# Patient Record
Sex: Male | Born: 1950 | Race: Black or African American | Hispanic: No | Marital: Married | Smoking: Former smoker
Health system: Southern US, Community
[De-identification: ages and names within clinical notes are randomized; demographics above are authoritative.]

## PROBLEM LIST (undated history)

## (undated) DIAGNOSIS — I639 Cerebral infarction, unspecified: Secondary | ICD-10-CM

## (undated) DIAGNOSIS — I82409 Acute embolism and thrombosis of unspecified deep veins of unspecified lower extremity: Secondary | ICD-10-CM

---

## 1999-07-19 ENCOUNTER — Emergency Department (HOSPITAL_COMMUNITY): Admission: EM | Admit: 1999-07-19 | Discharge: 1999-07-19 | Payer: Self-pay | Admitting: Emergency Medicine

## 2001-12-23 ENCOUNTER — Encounter: Payer: Self-pay | Admitting: Urology

## 2001-12-23 ENCOUNTER — Encounter: Admission: RE | Admit: 2001-12-23 | Discharge: 2001-12-23 | Payer: Self-pay | Admitting: Urology

## 2001-12-28 ENCOUNTER — Ambulatory Visit (HOSPITAL_BASED_OUTPATIENT_CLINIC_OR_DEPARTMENT_OTHER): Admission: RE | Admit: 2001-12-28 | Discharge: 2001-12-28 | Payer: Self-pay | Admitting: Urology

## 2002-05-26 ENCOUNTER — Inpatient Hospital Stay (HOSPITAL_COMMUNITY): Admission: RE | Admit: 2002-05-26 | Discharge: 2002-05-27 | Payer: Self-pay | Admitting: Neurosurgery

## 2002-05-26 ENCOUNTER — Encounter: Payer: Self-pay | Admitting: Neurosurgery

## 2002-08-02 ENCOUNTER — Encounter: Admission: RE | Admit: 2002-08-02 | Discharge: 2002-10-21 | Payer: Self-pay | Admitting: Neurosurgery

## 2005-03-14 ENCOUNTER — Ambulatory Visit: Payer: Self-pay | Admitting: Family Medicine

## 2005-03-14 ENCOUNTER — Inpatient Hospital Stay (HOSPITAL_COMMUNITY): Admission: EM | Admit: 2005-03-14 | Discharge: 2005-03-19 | Payer: Self-pay | Admitting: Emergency Medicine

## 2006-02-03 ENCOUNTER — Ambulatory Visit: Payer: Self-pay | Admitting: Oncology

## 2006-02-17 LAB — PROTIME-INR
INR: 1 — ABNORMAL LOW (ref 2.00–3.50)
Protime: 12.6 Seconds (ref 10.6–13.4)

## 2006-02-18 LAB — PROTIME-INR
INR: 1 — ABNORMAL LOW (ref 2.00–3.50)
Protime: 12.5 Seconds (ref 10.6–13.4)

## 2006-02-24 LAB — CBC WITH DIFFERENTIAL/PLATELET
Basophils Absolute: 0 10*3/uL (ref 0.0–0.1)
EOS%: 5 % (ref 0.0–7.0)
Eosinophils Absolute: 0.2 10*3/uL (ref 0.0–0.5)
LYMPH%: 45.3 % (ref 14.0–48.0)
MCH: 31.2 pg (ref 28.0–33.4)
MCV: 91.4 fL (ref 81.6–98.0)
MONO%: 5.5 % (ref 0.0–13.0)
Platelets: 218 10*3/uL (ref 145–400)
RBC: 4.53 10*6/uL (ref 4.20–5.71)
RDW: 12.5 % (ref 11.2–14.6)

## 2006-02-25 LAB — PROTHROMBIN TIME: Prothrombin Time: 14.3 seconds (ref 11.6–15.2)

## 2006-02-27 LAB — D-DIMER, QUANTITATIVE: D-Dimer, Quant: 0.62 ug/mL-FEU — ABNORMAL HIGH (ref 0.00–0.48)

## 2006-02-27 LAB — LACTATE DEHYDROGENASE: LDH: 109 U/L (ref 94–250)

## 2006-02-27 LAB — HYPERCOAGULABLE PANEL, COMPREHENSIVE
Anticardiolipin IgG: <7 [GPL'U] (ref <11)
Anticardiolipin IgM: 31 [MPL'U] (ref <10)
Beta-2 Glyco I IgG: <4 U/mL (ref <20)
Beta-2-Glycoprotein I IgM: <4 U/mL (ref <10)
Homocysteine: 12.7 umol/L (ref 4.0–15.4)
PTT Lupus Anticoagulant: 44.7 secs — ABNORMAL HIGH (ref 30.5–43.1)
PTTLA 4:1 Mix: 42 secs (ref 30.5–43.1)
Protein C Activity: 116 % (ref 91–147)
Protein C, Total: 69 % (ref 63–153)
Protein S Ag, Total: 101 % (ref 58–146)

## 2006-02-27 LAB — COMPREHENSIVE METABOLIC PANEL
AST: 14 U/L (ref 0–37)
Albumin: 4.1 g/dL (ref 3.5–5.2)
Alkaline Phosphatase: 67 U/L (ref 39–117)
BUN: 11 mg/dL (ref 6–23)
Glucose, Bld: 90 mg/dL (ref 70–99)
Potassium: 3.9 mEq/L (ref 3.5–5.3)
Sodium: 140 mEq/L (ref 135–145)
Total Bilirubin: 0.6 mg/dL (ref 0.3–1.2)

## 2006-02-28 LAB — PROTIME-INR
INR: 1.9 — ABNORMAL LOW (ref 2.00–3.50)
Protime: 17 Seconds — ABNORMAL HIGH (ref 10.6–13.4)

## 2006-03-03 LAB — PROTIME-INR

## 2006-03-06 LAB — PROTIME-INR: INR: 2.6 (ref 2.00–3.50)

## 2006-03-10 LAB — PROTIME-INR
INR: 2.4 (ref 2.00–3.50)
Protime: 18.8 Seconds — ABNORMAL HIGH (ref 10.6–13.4)

## 2006-03-14 ENCOUNTER — Ambulatory Visit (HOSPITAL_COMMUNITY): Admission: RE | Admit: 2006-03-14 | Discharge: 2006-03-14 | Payer: Self-pay | Admitting: Oncology

## 2006-03-17 ENCOUNTER — Ambulatory Visit: Payer: Self-pay | Admitting: Oncology

## 2006-03-17 LAB — PROTIME-INR

## 2006-03-20 ENCOUNTER — Ambulatory Visit (HOSPITAL_COMMUNITY): Admission: RE | Admit: 2006-03-20 | Discharge: 2006-03-20 | Payer: Self-pay | Admitting: Oncology

## 2006-03-20 ENCOUNTER — Encounter: Payer: Self-pay | Admitting: Vascular Surgery

## 2006-03-31 LAB — PROTIME-INR: Protime: 14.4 Seconds — ABNORMAL HIGH (ref 10.6–13.4)

## 2006-04-03 LAB — PROTIME-INR: Protime: 19.2 Seconds — ABNORMAL HIGH (ref 10.6–13.4)

## 2006-04-07 LAB — CBC WITH DIFFERENTIAL/PLATELET
Eosinophils Absolute: 0.2 10*3/uL (ref 0.0–0.5)
HCT: 41.7 % (ref 38.7–49.9)
LYMPH%: 38.6 % (ref 14.0–48.0)
MCHC: 34 g/dL (ref 32.0–35.9)
MCV: 91.2 fL (ref 81.6–98.0)
MONO#: 0.2 10*3/uL (ref 0.1–0.9)
MONO%: 4.6 % (ref 0.0–13.0)
NEUT#: 2.7 10*3/uL (ref 1.5–6.5)
NEUT%: 51.4 % (ref 40.0–75.0)
Platelets: 282 10*3/uL (ref 145–400)
RBC: 4.58 10*6/uL (ref 4.20–5.71)

## 2006-04-07 LAB — COMPREHENSIVE METABOLIC PANEL
Albumin: 3.9 g/dL (ref 3.5–5.2)
CO2: 27 mEq/L (ref 19–32)
Glucose, Bld: 88 mg/dL (ref 70–99)
Sodium: 141 mEq/L (ref 135–145)
Total Bilirubin: 0.6 mg/dL (ref 0.3–1.2)
Total Protein: 7.2 g/dL (ref 6.0–8.3)

## 2006-04-07 LAB — LACTATE DEHYDROGENASE: LDH: 115 U/L (ref 94–250)

## 2006-04-07 LAB — D-DIMER, QUANTITATIVE: D-Dimer, Quant: 0.51 ug/mL-FEU — ABNORMAL HIGH (ref 0.00–0.48)

## 2006-04-10 ENCOUNTER — Ambulatory Visit: Payer: Self-pay | Admitting: Oncology

## 2006-04-10 LAB — PROTIME-INR

## 2006-04-23 LAB — PROTIME-INR: Protime: 25.2 Seconds — ABNORMAL HIGH (ref 10.6–13.4)

## 2006-04-29 LAB — PROTIME-INR
INR: 2.7 (ref 2.00–3.50)
Protime: 32.4 Seconds — ABNORMAL HIGH (ref 10.6–13.4)

## 2006-05-02 LAB — PROTIME-INR
INR: 2.6 (ref 2.00–3.50)
Protime: 31.2 Seconds — ABNORMAL HIGH (ref 10.6–13.4)

## 2006-05-15 LAB — CBC WITH DIFFERENTIAL/PLATELET
BASO%: 0.7 % (ref 0.0–2.0)
Basophils Absolute: 0 10*3/uL (ref 0.0–0.1)
EOS%: 3.8 % (ref 0.0–7.0)
HCT: 43.7 % (ref 38.7–49.9)
HGB: 14.8 g/dL (ref 13.0–17.1)
LYMPH%: 43.1 % (ref 14.0–48.0)
MCH: 30.9 pg (ref 28.0–33.4)
MCHC: 34 g/dL (ref 32.0–35.9)
MCV: 91 fL (ref 81.6–98.0)
MONO%: 5.6 % (ref 0.0–13.0)
NEUT%: 46.8 % (ref 40.0–75.0)
Platelets: 223 10*3/uL (ref 145–400)

## 2006-05-15 LAB — PROTIME-INR

## 2006-05-15 LAB — COMPREHENSIVE METABOLIC PANEL
BUN: 15 mg/dL (ref 6–23)
CO2: 25 mEq/L (ref 19–32)
Calcium: 8.8 mg/dL (ref 8.4–10.5)
Chloride: 103 mEq/L (ref 96–112)
Creatinine, Ser: 1.1 mg/dL (ref 0.40–1.50)
Glucose, Bld: 88 mg/dL (ref 70–99)
Total Bilirubin: 0.5 mg/dL (ref 0.3–1.2)

## 2006-05-27 ENCOUNTER — Ambulatory Visit: Payer: Self-pay | Admitting: Oncology

## 2006-05-29 LAB — PROTIME-INR: INR: 3.5 (ref 2.00–3.50)

## 2006-06-19 LAB — PROTIME-INR

## 2006-06-26 LAB — PROTIME-INR

## 2006-06-30 LAB — PROTIME-INR: Protime: 21.6 Seconds — ABNORMAL HIGH (ref 10.6–13.4)

## 2006-07-03 LAB — PROTIME-INR: Protime: 28.8 Seconds — ABNORMAL HIGH (ref 10.6–13.4)

## 2006-07-08 ENCOUNTER — Ambulatory Visit: Payer: Self-pay | Admitting: Oncology

## 2006-07-11 LAB — PROTIME-INR: INR: 3.2 (ref 2.00–3.50)

## 2006-07-17 LAB — CBC WITH DIFFERENTIAL/PLATELET
Basophils Absolute: 0.1 10*3/uL (ref 0.0–0.1)
HCT: 43.8 % (ref 38.7–49.9)
HGB: 14.9 g/dL (ref 13.0–17.1)
MONO#: 0.3 10*3/uL (ref 0.1–0.9)
NEUT#: 2.7 10*3/uL (ref 1.5–6.5)
NEUT%: 55.2 % (ref 40.0–75.0)
RDW: 10.6 % — ABNORMAL LOW (ref 11.2–14.6)
WBC: 5 10*3/uL (ref 4.0–10.0)
lymph#: 1.6 10*3/uL (ref 0.9–3.3)

## 2006-07-17 LAB — PROTIME-INR: INR: 2.4 (ref 2.00–3.50)

## 2006-07-24 LAB — PROTIME-INR: INR: 3.2 (ref 2.00–3.50)

## 2006-07-31 LAB — PROTIME-INR
INR: 3.2 (ref 2.00–3.50)
Protime: 38.4 Seconds — ABNORMAL HIGH (ref 10.6–13.4)

## 2006-08-07 LAB — CBC WITH DIFFERENTIAL/PLATELET
Eosinophils Absolute: 0.2 10*3/uL (ref 0.0–0.5)
MCV: 89.2 fL (ref 81.6–98.0)
MONO%: 11 % (ref 0.0–13.0)
NEUT#: 2.6 10*3/uL (ref 1.5–6.5)
RBC: 4.81 10*6/uL (ref 4.20–5.71)
RDW: 10.4 % — ABNORMAL LOW (ref 11.2–14.6)
WBC: 4.9 10*3/uL (ref 4.0–10.0)

## 2006-08-07 LAB — PROTIME-INR
INR: 2.8 (ref 2.00–3.50)
Protime: 33.6 Seconds — ABNORMAL HIGH (ref 10.6–13.4)

## 2006-08-14 LAB — PROTIME-INR: INR: 1.9 — ABNORMAL LOW (ref 2.00–3.50)

## 2006-08-26 ENCOUNTER — Ambulatory Visit: Payer: Self-pay | Admitting: Oncology

## 2006-08-28 LAB — PROTIME-INR: INR: 2.5 (ref 2.00–3.50)

## 2006-09-04 LAB — PROTIME-INR: INR: 2.3 (ref 2.00–3.50)

## 2006-09-11 LAB — PROTIME-INR: INR: 2.4 (ref 2.00–3.50)

## 2006-09-18 LAB — PROTIME-INR: Protime: 44.4 Seconds — ABNORMAL HIGH (ref 10.6–13.4)

## 2006-09-22 LAB — PROTIME-INR: Protime: 28.8 Seconds — ABNORMAL HIGH (ref 10.6–13.4)

## 2006-09-25 LAB — PROTIME-INR
INR: 2.1 (ref 2.00–3.50)
Protime: 25.2 Seconds — ABNORMAL HIGH (ref 10.6–13.4)

## 2006-09-25 LAB — COMPREHENSIVE METABOLIC PANEL
ALT: 16 U/L (ref 0–53)
AST: 17 U/L (ref 0–37)
Alkaline Phosphatase: 77 U/L (ref 39–117)
Calcium: 9.5 mg/dL (ref 8.4–10.5)
Chloride: 108 mEq/L (ref 96–112)
Creatinine, Ser: 1.11 mg/dL (ref 0.40–1.50)

## 2006-09-25 LAB — CBC WITH DIFFERENTIAL/PLATELET
Basophils Absolute: 0 10*3/uL (ref 0.0–0.1)
EOS%: 4 % (ref 0.0–7.0)
Eosinophils Absolute: 0.2 10*3/uL (ref 0.0–0.5)
HGB: 14.4 g/dL (ref 13.0–17.1)
MCH: 31.5 pg (ref 28.0–33.4)
NEUT#: 3.1 10*3/uL (ref 1.5–6.5)
RDW: 12.2 % (ref 11.2–14.6)
lymph#: 1.6 10*3/uL (ref 0.9–3.3)

## 2006-10-02 LAB — COMPREHENSIVE METABOLIC PANEL
ALT: 15 U/L (ref 0–53)
AST: 17 U/L (ref 0–37)
Albumin: 4.2 g/dL (ref 3.5–5.2)
Alkaline Phosphatase: 78 U/L (ref 39–117)
Glucose, Bld: 91 mg/dL (ref 70–99)
Potassium: 4.2 mEq/L (ref 3.5–5.3)
Sodium: 140 mEq/L (ref 135–145)
Total Protein: 7.1 g/dL (ref 6.0–8.3)

## 2006-10-02 LAB — CBC WITH DIFFERENTIAL/PLATELET
Basophils Absolute: 0.1 10*3/uL (ref 0.0–0.1)
EOS%: 4 % (ref 0.0–7.0)
Eosinophils Absolute: 0.2 10*3/uL (ref 0.0–0.5)
HCT: 43.8 % (ref 38.7–49.9)
HGB: 15.2 g/dL (ref 13.0–17.1)
LYMPH%: 34.4 % (ref 14.0–48.0)
MCH: 31.4 pg (ref 28.0–33.4)
MCV: 90.7 fL (ref 81.6–98.0)
MONO%: 5.8 % (ref 0.0–13.0)
NEUT%: 54.2 % (ref 40.0–75.0)
Platelets: 221 10*3/uL (ref 145–400)

## 2006-10-13 ENCOUNTER — Ambulatory Visit: Payer: Self-pay | Admitting: Oncology

## 2006-10-16 LAB — PROTIME-INR: Protime: 20.4 Seconds — ABNORMAL HIGH (ref 10.6–13.4)

## 2006-10-23 LAB — PROTIME-INR: INR: 1.9 — ABNORMAL LOW (ref 2.00–3.50)

## 2006-11-13 LAB — CBC WITH DIFFERENTIAL/PLATELET
Basophils Absolute: 0 10*3/uL (ref 0.0–0.1)
Eosinophils Absolute: 0.2 10*3/uL (ref 0.0–0.5)
HGB: 14.2 g/dL (ref 13.0–17.1)
NEUT#: 3 10*3/uL (ref 1.5–6.5)
RBC: 4.52 10*6/uL (ref 4.20–5.71)
RDW: 12 % (ref 11.2–14.6)
WBC: 4.9 10*3/uL (ref 4.0–10.0)
lymph#: 1.4 10*3/uL (ref 0.9–3.3)

## 2006-11-13 LAB — COMPREHENSIVE METABOLIC PANEL
ALT: 19 U/L (ref 0–53)
AST: 17 U/L (ref 0–37)
Alkaline Phosphatase: 73 U/L (ref 39–117)
CO2: 25 mEq/L (ref 19–32)
Creatinine, Ser: 1.05 mg/dL (ref 0.40–1.50)
Sodium: 141 mEq/L (ref 135–145)
Total Bilirubin: 0.4 mg/dL (ref 0.3–1.2)
Total Protein: 6.9 g/dL (ref 6.0–8.3)

## 2006-11-27 LAB — PROTIME-INR

## 2006-12-08 ENCOUNTER — Ambulatory Visit: Payer: Self-pay | Admitting: Oncology

## 2006-12-11 LAB — PROTIME-INR
INR: 2.2 (ref 2.00–3.50)
Protime: 26.4 Seconds — ABNORMAL HIGH (ref 10.6–13.4)

## 2006-12-25 LAB — PROTIME-INR
INR: 2.2 (ref 2.00–3.50)
Protime: 26.4 Seconds — ABNORMAL HIGH (ref 10.6–13.4)

## 2007-01-21 ENCOUNTER — Ambulatory Visit: Payer: Self-pay | Admitting: Oncology

## 2007-01-21 LAB — CBC WITH DIFFERENTIAL/PLATELET
EOS%: 3.5 % (ref 0.0–7.0)
Eosinophils Absolute: 0.2 10*3/uL (ref 0.0–0.5)
LYMPH%: 38 % (ref 14.0–48.0)
MCH: 30.8 pg (ref 28.0–33.4)
MCV: 89.4 fL (ref 81.6–98.0)
MONO%: 5 % (ref 0.0–13.0)
Platelets: 199 10*3/uL (ref 145–400)
RBC: 4.53 10*6/uL (ref 4.20–5.71)
RDW: 11.7 % (ref 11.2–14.6)

## 2007-01-21 LAB — COMPREHENSIVE METABOLIC PANEL
Albumin: 4 g/dL (ref 3.5–5.2)
BUN: 17 mg/dL (ref 6–23)
CO2: 25 mEq/L (ref 19–32)
Calcium: 9 mg/dL (ref 8.4–10.5)
Glucose, Bld: 88 mg/dL (ref 70–99)
Potassium: 4.3 mEq/L (ref 3.5–5.3)
Sodium: 141 mEq/L (ref 135–145)
Total Protein: 6.8 g/dL (ref 6.0–8.3)

## 2007-01-21 LAB — PROTIME-INR: Protime: 21.6 Seconds — ABNORMAL HIGH (ref 10.6–13.4)

## 2007-01-21 LAB — LACTATE DEHYDROGENASE: LDH: 117 U/L (ref 94–250)

## 2007-02-04 LAB — PROTIME-INR
INR: 1.4 — ABNORMAL LOW (ref 2.00–3.50)
Protime: 16.8 Seconds — ABNORMAL HIGH (ref 10.6–13.4)

## 2007-03-17 ENCOUNTER — Ambulatory Visit: Payer: Self-pay | Admitting: Oncology

## 2007-04-16 LAB — PROTIME-INR: Protime: 24 Seconds — ABNORMAL HIGH (ref 10.6–13.4)

## 2007-05-12 ENCOUNTER — Ambulatory Visit: Payer: Self-pay | Admitting: Oncology

## 2007-05-14 LAB — PROTIME-INR: Protime: 27.6 Seconds — ABNORMAL HIGH (ref 10.6–13.4)

## 2007-06-11 LAB — PROTIME-INR: INR: 1.7 — ABNORMAL LOW (ref 2.00–3.50)

## 2007-07-07 ENCOUNTER — Ambulatory Visit: Payer: Self-pay | Admitting: Oncology

## 2007-07-09 LAB — PROTIME-INR: INR: 1.8 — ABNORMAL LOW (ref 2.00–3.50)

## 2007-07-20 LAB — CBC WITH DIFFERENTIAL/PLATELET
BASO%: 0.6 % (ref 0.0–2.0)
EOS%: 4.6 % (ref 0.0–7.0)
HGB: 14.3 g/dL (ref 13.0–17.1)
MCH: 30.8 pg (ref 28.0–33.4)
MCHC: 34.3 g/dL (ref 32.0–35.9)
RDW: 12.1 % (ref 11.2–14.6)
lymph#: 1.5 10*3/uL (ref 0.9–3.3)

## 2007-07-20 LAB — PROTIME-INR: INR: 2.4 (ref 2.00–3.50)

## 2007-07-21 LAB — COMPREHENSIVE METABOLIC PANEL
Albumin: 4 g/dL (ref 3.5–5.2)
Alkaline Phosphatase: 66 U/L (ref 39–117)
BUN: 18 mg/dL (ref 6–23)
Creatinine, Ser: 1.3 mg/dL (ref 0.40–1.50)
Glucose, Bld: 84 mg/dL (ref 70–99)
Total Bilirubin: 0.4 mg/dL (ref 0.3–1.2)

## 2007-07-21 LAB — D-DIMER, QUANTITATIVE: D-Dimer, Quant: 0.4 ug/mL-FEU (ref 0.00–0.48)

## 2007-07-21 LAB — FACTOR 8 ASSAY: Coagulation Factor VIII: 164 % — ABNORMAL HIGH (ref 73–140)

## 2007-08-03 LAB — PROTIME-INR
INR: 2.1 (ref 2.00–3.50)
Protime: 25.2 Seconds — ABNORMAL HIGH (ref 10.6–13.4)

## 2007-08-17 LAB — PROTIME-INR
INR: 1.9 — ABNORMAL LOW (ref 2.00–3.50)
Protime: 22.8 Seconds — ABNORMAL HIGH (ref 10.6–13.4)

## 2007-09-10 ENCOUNTER — Ambulatory Visit: Payer: Self-pay | Admitting: Oncology

## 2007-10-12 LAB — PROTIME-INR: INR: 2.7 (ref 2.00–3.50)

## 2007-11-05 ENCOUNTER — Ambulatory Visit: Payer: Self-pay | Admitting: Oncology

## 2007-12-07 LAB — PROTIME-INR
INR: 2.1 (ref 2.00–3.50)
Protime: 25.2 Seconds — ABNORMAL HIGH (ref 10.6–13.4)

## 2007-12-31 ENCOUNTER — Ambulatory Visit: Payer: Self-pay | Admitting: Oncology

## 2008-02-24 ENCOUNTER — Ambulatory Visit: Payer: Self-pay | Admitting: Oncology

## 2008-02-29 LAB — PROTIME-INR
INR: 1.3 — ABNORMAL LOW (ref 2.00–3.50)
Protime: 15.6 Seconds — ABNORMAL HIGH (ref 10.6–13.4)

## 2008-03-28 LAB — PROTIME-INR

## 2008-04-20 ENCOUNTER — Ambulatory Visit: Payer: Self-pay | Admitting: Oncology

## 2008-04-20 ENCOUNTER — Emergency Department (HOSPITAL_COMMUNITY): Admission: EM | Admit: 2008-04-20 | Discharge: 2008-04-20 | Payer: Self-pay

## 2008-04-22 LAB — PROTIME-INR: Protime: 18 Seconds — ABNORMAL HIGH (ref 10.6–13.4)

## 2008-05-12 LAB — CBC WITH DIFFERENTIAL/PLATELET
BASO%: 0.4 % (ref 0.0–2.0)
MCHC: 33.8 g/dL (ref 32.0–35.9)
MONO#: 0.2 10*3/uL (ref 0.1–0.9)
NEUT#: 2.8 10*3/uL (ref 1.5–6.5)
RBC: 4.59 10*6/uL (ref 4.20–5.71)
WBC: 5 10*3/uL (ref 4.0–10.0)
lymph#: 1.7 10*3/uL (ref 0.9–3.3)

## 2008-05-12 LAB — COMPREHENSIVE METABOLIC PANEL
ALT: 17 U/L (ref 0–53)
AST: 15 U/L (ref 0–37)
Alkaline Phosphatase: 58 U/L (ref 39–117)
Sodium: 141 mEq/L (ref 135–145)
Total Bilirubin: 0.5 mg/dL (ref 0.3–1.2)
Total Protein: 7.1 g/dL (ref 6.0–8.3)

## 2008-05-12 LAB — PROTIME-INR
INR: 1.8 — ABNORMAL LOW (ref 2.00–3.50)
Protime: 21.6 Seconds — ABNORMAL HIGH (ref 10.6–13.4)

## 2008-05-12 LAB — LACTATE DEHYDROGENASE: LDH: 118 U/L (ref 94–250)

## 2008-05-26 LAB — PROTIME-INR
INR: 1.8 — ABNORMAL LOW (ref 2.00–3.50)
Protime: 21.6 Seconds — ABNORMAL HIGH (ref 10.6–13.4)

## 2008-06-07 ENCOUNTER — Ambulatory Visit: Payer: Self-pay | Admitting: Oncology

## 2008-06-09 LAB — PROTIME-INR

## 2008-07-07 LAB — PROTIME-INR
INR: 2.2 (ref 2.00–3.50)
Protime: 26.4 Seconds — ABNORMAL HIGH (ref 10.6–13.4)

## 2008-08-02 ENCOUNTER — Ambulatory Visit: Payer: Self-pay | Admitting: Oncology

## 2008-08-04 LAB — PROTIME-INR

## 2008-09-01 LAB — PROTIME-INR: INR: 1.4 — ABNORMAL LOW (ref 2.00–3.50)

## 2008-09-27 ENCOUNTER — Ambulatory Visit: Payer: Self-pay | Admitting: Oncology

## 2008-10-20 LAB — PROTIME-INR
INR: 1.7 — ABNORMAL LOW (ref 2.00–3.50)
Protime: 20.4 Seconds — ABNORMAL HIGH (ref 10.6–13.4)

## 2008-11-10 ENCOUNTER — Ambulatory Visit: Payer: Self-pay | Admitting: Oncology

## 2008-11-10 LAB — COMPREHENSIVE METABOLIC PANEL
ALT: 16 U/L (ref 0–53)
AST: 16 U/L (ref 0–37)
BUN: 17 mg/dL (ref 6–23)
Creatinine, Ser: 1.12 mg/dL (ref 0.40–1.50)
Total Bilirubin: 0.4 mg/dL (ref 0.3–1.2)

## 2008-11-10 LAB — CBC WITH DIFFERENTIAL/PLATELET
BASO%: 0.5 % (ref 0.0–2.0)
EOS%: 3.6 % (ref 0.0–7.0)
MCH: 31.5 pg (ref 27.2–33.4)
MCHC: 34.1 g/dL (ref 32.0–36.0)
MONO%: 6 % (ref 0.0–14.0)
RBC: 4.63 10*6/uL (ref 4.20–5.82)
RDW: 12.4 % (ref 11.0–14.6)
lymph#: 1.5 10*3/uL (ref 0.9–3.3)

## 2008-11-10 LAB — LACTATE DEHYDROGENASE: LDH: 135 U/L (ref 94–250)

## 2008-11-10 LAB — PROTIME-INR: INR: 2 (ref 2.00–3.50)

## 2008-12-08 LAB — PROTIME-INR
INR: 1.8 — ABNORMAL LOW (ref 2.00–3.50)
Protime: 21.6 s — ABNORMAL HIGH (ref 10.6–13.4)

## 2009-01-03 ENCOUNTER — Ambulatory Visit: Payer: Self-pay | Admitting: Oncology

## 2009-01-05 LAB — PROTIME-INR: Protime: 20.4 Seconds — ABNORMAL HIGH (ref 10.6–13.4)

## 2009-01-30 ENCOUNTER — Emergency Department (HOSPITAL_COMMUNITY): Admission: EM | Admit: 2009-01-30 | Discharge: 2009-01-30 | Payer: Self-pay | Admitting: Emergency Medicine

## 2009-02-02 LAB — PROTIME-INR: INR: 1.6 — ABNORMAL LOW (ref 2.00–3.50)

## 2009-02-15 ENCOUNTER — Emergency Department (HOSPITAL_COMMUNITY): Admission: EM | Admit: 2009-02-15 | Discharge: 2009-02-15 | Payer: Self-pay | Admitting: Emergency Medicine

## 2009-02-28 ENCOUNTER — Ambulatory Visit: Payer: Self-pay | Admitting: Oncology

## 2009-03-02 LAB — PROTIME-INR: INR: 1.7 — ABNORMAL LOW (ref 2.00–3.50)

## 2009-03-30 ENCOUNTER — Ambulatory Visit: Payer: Self-pay | Admitting: Oncology

## 2009-03-30 LAB — PROTIME-INR
INR: 2.5 (ref 2.00–3.50)
Protime: 30 Seconds — ABNORMAL HIGH (ref 10.6–13.4)

## 2009-04-06 LAB — PROTIME-INR
INR: 1.4 — ABNORMAL LOW (ref 2.00–3.50)
Protime: 16.8 Seconds — ABNORMAL HIGH (ref 10.6–13.4)

## 2009-05-09 ENCOUNTER — Ambulatory Visit: Payer: Self-pay | Admitting: Oncology

## 2009-05-11 LAB — CBC WITH DIFFERENTIAL/PLATELET
BASO%: 1.3 % (ref 0.0–2.0)
EOS%: 3.7 % (ref 0.0–7.0)
HCT: 41.7 % (ref 38.4–49.9)
MCH: 30.7 pg (ref 27.2–33.4)
MCHC: 34.8 g/dL (ref 32.0–36.0)
MONO#: 0.4 10*3/uL (ref 0.1–0.9)
RDW: 11.8 % (ref 11.0–14.6)
WBC: 4.6 10*3/uL (ref 4.0–10.3)
lymph#: 1.4 10*3/uL (ref 0.9–3.3)
nRBC: 0 % (ref 0–0)

## 2009-05-11 LAB — PROTIME-INR: INR: 2 (ref 2.00–3.50)

## 2009-05-11 LAB — COMPREHENSIVE METABOLIC PANEL
ALT: 24 U/L (ref 0–53)
BUN: 14 mg/dL (ref 6–23)
CO2: 27 mEq/L (ref 19–32)
Calcium: 9.1 mg/dL (ref 8.4–10.5)
Chloride: 107 mEq/L (ref 96–112)
Creatinine, Ser: 1.18 mg/dL (ref 0.40–1.50)
Glucose, Bld: 89 mg/dL (ref 70–99)
Total Bilirubin: 0.7 mg/dL (ref 0.3–1.2)

## 2009-05-11 LAB — LACTATE DEHYDROGENASE: LDH: 130 U/L (ref 94–250)

## 2009-06-08 ENCOUNTER — Ambulatory Visit: Payer: Self-pay | Admitting: Oncology

## 2009-06-08 LAB — PROTIME-INR: INR: 1.2 — ABNORMAL LOW (ref 2.00–3.50)

## 2009-07-06 ENCOUNTER — Ambulatory Visit: Payer: Self-pay | Admitting: Oncology

## 2009-07-06 LAB — PROTIME-INR
INR: 2.1 (ref 2.00–3.50)
Protime: 25.2 Seconds — ABNORMAL HIGH (ref 10.6–13.4)

## 2009-08-03 LAB — PROTIME-INR

## 2009-08-29 ENCOUNTER — Ambulatory Visit: Payer: Self-pay | Admitting: Oncology

## 2009-09-28 ENCOUNTER — Ambulatory Visit: Payer: Self-pay | Admitting: Oncology

## 2009-09-28 LAB — PROTIME-INR

## 2009-10-26 LAB — PROTIME-INR
INR: 1.9 — ABNORMAL LOW (ref 2.00–3.50)
Protime: 22.8 Seconds — ABNORMAL HIGH (ref 10.6–13.4)

## 2009-10-26 LAB — LACTATE DEHYDROGENASE: LDH: 123 U/L (ref 94–250)

## 2009-10-26 LAB — CBC WITH DIFFERENTIAL/PLATELET
BASO%: 1.2 % (ref 0.0–2.0)
HGB: 14.5 g/dL (ref 13.0–17.1)
MCH: 30.3 pg (ref 27.2–33.4)
MCHC: 34 g/dL (ref 32.0–36.0)
MCV: 89.3 fL (ref 79.3–98.0)
MONO#: 0.3 10*3/uL (ref 0.1–0.9)
MONO%: 7.9 % (ref 0.0–14.0)
NEUT#: 2.2 10*3/uL (ref 1.5–6.5)
NEUT%: 52.3 % (ref 39.0–75.0)
RBC: 4.78 10*6/uL (ref 4.20–5.82)
RDW: 11.9 % (ref 11.0–14.6)
lymph#: 1.5 10*3/uL (ref 0.9–3.3)

## 2009-10-26 LAB — COMPREHENSIVE METABOLIC PANEL
AST: 20 U/L (ref 0–37)
Albumin: 4.2 g/dL (ref 3.5–5.2)
Alkaline Phosphatase: 57 U/L (ref 39–117)
CO2: 27 mEq/L (ref 19–32)
Glucose, Bld: 83 mg/dL (ref 70–99)
Potassium: 4.3 mEq/L (ref 3.5–5.3)
Sodium: 140 mEq/L (ref 135–145)
Total Bilirubin: 0.5 mg/dL (ref 0.3–1.2)

## 2009-11-21 ENCOUNTER — Ambulatory Visit: Payer: Self-pay | Admitting: Oncology

## 2009-11-23 LAB — PROTIME-INR
INR: 1.7 — ABNORMAL LOW (ref 2.00–3.50)
Protime: 20.4 s — ABNORMAL HIGH (ref 10.6–13.4)

## 2009-11-29 ENCOUNTER — Ambulatory Visit (HOSPITAL_COMMUNITY): Admission: RE | Admit: 2009-11-29 | Discharge: 2009-11-29 | Payer: Self-pay | Admitting: General Surgery

## 2009-12-04 LAB — PROTIME-INR: INR: 1.6 — ABNORMAL LOW (ref 2.00–3.50)

## 2009-12-07 LAB — PROTIME-INR: INR: 2 (ref 2.00–3.50)

## 2009-12-14 LAB — PROTIME-INR: Protime: 19.2 Seconds — ABNORMAL HIGH (ref 10.6–13.4)

## 2009-12-21 ENCOUNTER — Other Ambulatory Visit (HOSPITAL_COMMUNITY): Payer: Self-pay | Admitting: Oncology

## 2009-12-21 LAB — PROTIME-INR: Protime: 25.2 Seconds — ABNORMAL HIGH (ref 10.6–13.4)

## 2009-12-27 ENCOUNTER — Ambulatory Visit: Payer: Self-pay | Admitting: Oncology

## 2009-12-28 LAB — PROTIME-INR: INR: 1.7 — ABNORMAL LOW (ref 2.00–3.50)

## 2010-01-19 LAB — PROTIME-INR: Protime: 42 Seconds — ABNORMAL HIGH (ref 10.6–13.4)

## 2010-01-26 ENCOUNTER — Ambulatory Visit: Payer: Self-pay | Admitting: Oncology

## 2010-02-15 LAB — PROTIME-INR

## 2010-03-15 ENCOUNTER — Ambulatory Visit: Payer: Self-pay | Admitting: Oncology

## 2010-03-15 LAB — PROTIME-INR
INR: 2.4 (ref 2.00–3.50)
Protime: 28.8 Seconds — ABNORMAL HIGH (ref 10.6–13.4)

## 2010-04-12 LAB — PROTIME-INR: INR: 1.9 — ABNORMAL LOW (ref 2.00–3.50)

## 2010-04-19 ENCOUNTER — Ambulatory Visit: Payer: Self-pay | Admitting: Oncology

## 2010-04-24 LAB — COMPREHENSIVE METABOLIC PANEL
CO2: 24 mEq/L (ref 19–32)
Calcium: 8.9 mg/dL (ref 8.4–10.5)
Chloride: 104 mEq/L (ref 96–112)
Potassium: 4.4 mEq/L (ref 3.5–5.3)
Sodium: 136 mEq/L (ref 135–145)
Total Protein: 7.1 g/dL (ref 6.0–8.3)

## 2010-04-24 LAB — PROTIME-INR
INR: 2.1 (ref 2.00–3.50)
Protime: 25.2 Seconds — ABNORMAL HIGH (ref 10.6–13.4)

## 2010-04-24 LAB — CBC WITH DIFFERENTIAL/PLATELET
HCT: 43.5 % (ref 38.4–49.9)
HGB: 14.5 g/dL (ref 13.0–17.1)
LYMPH%: 32.7 % (ref 14.0–49.0)
MCV: 91.8 fL (ref 79.3–98.0)
MONO#: 0.3 10*3/uL (ref 0.1–0.9)
WBC: 4.7 10*3/uL (ref 4.0–10.3)
lymph#: 1.5 10*3/uL (ref 0.9–3.3)

## 2010-04-24 LAB — LACTATE DEHYDROGENASE: LDH: 126 U/L (ref 94–250)

## 2010-05-22 ENCOUNTER — Ambulatory Visit: Payer: Self-pay | Admitting: Oncology

## 2010-05-24 LAB — PROTIME-INR
INR: 2 (ref 2.00–3.50)
Protime: 24 Seconds — ABNORMAL HIGH (ref 10.6–13.4)

## 2010-06-21 ENCOUNTER — Ambulatory Visit: Payer: Self-pay | Admitting: Oncology

## 2010-07-25 ENCOUNTER — Ambulatory Visit: Payer: Self-pay | Admitting: Oncology

## 2010-07-25 LAB — PROTIME-INR
INR: 1.3 — ABNORMAL LOW (ref 2.00–3.50)
Protime: 15.6 Seconds — ABNORMAL HIGH (ref 10.6–13.4)

## 2010-08-24 ENCOUNTER — Ambulatory Visit (HOSPITAL_BASED_OUTPATIENT_CLINIC_OR_DEPARTMENT_OTHER): Payer: Medicare Other | Admitting: Oncology

## 2010-08-24 LAB — PROTIME-INR
INR: 1.9 — ABNORMAL LOW (ref 2.00–3.50)
Protime: 22.8 Seconds — ABNORMAL HIGH (ref 10.6–13.4)

## 2010-09-09 ENCOUNTER — Encounter (HOSPITAL_COMMUNITY): Payer: Self-pay | Admitting: Oncology

## 2010-09-24 ENCOUNTER — Encounter: Payer: Medicare Other | Admitting: Oncology

## 2010-09-24 DIAGNOSIS — Z86718 Personal history of other venous thrombosis and embolism: Secondary | ICD-10-CM

## 2010-09-24 DIAGNOSIS — Z5181 Encounter for therapeutic drug level monitoring: Secondary | ICD-10-CM

## 2010-09-24 LAB — PROTIME-INR: INR: 2.2 (ref 2.00–3.50)

## 2010-10-23 ENCOUNTER — Other Ambulatory Visit (HOSPITAL_COMMUNITY): Payer: Self-pay | Admitting: Oncology

## 2010-10-23 ENCOUNTER — Encounter (HOSPITAL_BASED_OUTPATIENT_CLINIC_OR_DEPARTMENT_OTHER): Payer: Medicare Other | Admitting: Oncology

## 2010-10-23 DIAGNOSIS — Z7901 Long term (current) use of anticoagulants: Secondary | ICD-10-CM

## 2010-10-23 DIAGNOSIS — Z86718 Personal history of other venous thrombosis and embolism: Secondary | ICD-10-CM

## 2010-10-23 LAB — PROTIME-INR: Protime: 25.2 Seconds — ABNORMAL HIGH (ref 10.6–13.4)

## 2010-10-23 LAB — CBC WITH DIFFERENTIAL/PLATELET
BASO%: 2.9 % — ABNORMAL HIGH (ref 0.0–2.0)
Basophils Absolute: 0.1 10*3/uL (ref 0.0–0.1)
EOS%: 4 % (ref 0.0–7.0)
MCH: 31 pg (ref 27.2–33.4)
MCV: 91.5 fL (ref 79.3–98.0)
MONO#: 0.3 10*3/uL (ref 0.1–0.9)
NEUT#: 2.3 10*3/uL (ref 1.5–6.5)
Platelets: 200 10*3/uL (ref 140–400)
WBC: 4.6 10*3/uL (ref 4.0–10.3)

## 2010-10-23 LAB — COMPREHENSIVE METABOLIC PANEL
ALT: 14 U/L (ref 0–53)
AST: 18 U/L (ref 0–37)
Alkaline Phosphatase: 59 U/L (ref 39–117)
CO2: 26 mEq/L (ref 19–32)
Chloride: 103 mEq/L (ref 96–112)
Creatinine, Ser: 1.14 mg/dL (ref 0.40–1.50)
Potassium: 4.1 mEq/L (ref 3.5–5.3)
Total Bilirubin: 0.5 mg/dL (ref 0.3–1.2)
Total Protein: 6.7 g/dL (ref 6.0–8.3)

## 2010-11-07 LAB — COMPREHENSIVE METABOLIC PANEL
Albumin: 4 g/dL (ref 3.5–5.2)
CO2: 31 mEq/L (ref 19–32)
Potassium: 4.5 mEq/L (ref 3.5–5.1)
Sodium: 139 mEq/L (ref 135–145)
Total Bilirubin: 0.9 mg/dL (ref 0.3–1.2)

## 2010-11-07 LAB — DIFFERENTIAL
Basophils Absolute: 0 10*3/uL (ref 0.0–0.1)
Basophils Relative: 1 % (ref 0–1)
Eosinophils Absolute: 0.1 10*3/uL (ref 0.0–0.7)
Eosinophils Relative: 3 % (ref 0–5)
Lymphocytes Relative: 31 % (ref 12–46)
Lymphs Abs: 1.5 10*3/uL (ref 0.7–4.0)
Monocytes Absolute: 0.3 10*3/uL (ref 0.1–1.0)
Monocytes Relative: 6 % (ref 3–12)
Neutro Abs: 2.8 10*3/uL (ref 1.7–7.7)

## 2010-11-07 LAB — URINALYSIS, ROUTINE W REFLEX MICROSCOPIC
Hgb urine dipstick: NEGATIVE
Specific Gravity, Urine: 1.013 (ref 1.005–1.030)
Urobilinogen, UA: 1 mg/dL (ref 0.0–1.0)

## 2010-11-07 LAB — CBC
HCT: 45.3 % (ref 39.0–52.0)
Hemoglobin: 15.1 g/dL (ref 13.0–17.0)
MCHC: 33.3 g/dL (ref 30.0–36.0)
MCV: 93.3 fL (ref 78.0–100.0)
RBC: 4.86 MIL/uL (ref 4.22–5.81)
RDW: 12.4 % (ref 11.5–15.5)

## 2010-11-07 LAB — APTT: aPTT: 31 seconds (ref 24–37)

## 2010-11-07 LAB — PROTIME-INR: Prothrombin Time: 14 seconds (ref 11.6–15.2)

## 2010-11-20 ENCOUNTER — Encounter: Payer: Medicare Other | Admitting: Oncology

## 2010-11-20 ENCOUNTER — Other Ambulatory Visit (HOSPITAL_COMMUNITY): Payer: Self-pay | Admitting: Oncology

## 2010-11-20 LAB — PROTIME-INR
INR: 1.6 — ABNORMAL LOW (ref 2.00–3.50)
Protime: 19.2 Seconds — ABNORMAL HIGH (ref 10.6–13.4)

## 2010-12-18 ENCOUNTER — Other Ambulatory Visit (HOSPITAL_COMMUNITY): Payer: Self-pay | Admitting: Oncology

## 2010-12-18 ENCOUNTER — Encounter (HOSPITAL_BASED_OUTPATIENT_CLINIC_OR_DEPARTMENT_OTHER): Payer: Medicare HMO | Admitting: Oncology

## 2010-12-18 DIAGNOSIS — Z7901 Long term (current) use of anticoagulants: Secondary | ICD-10-CM

## 2010-12-18 LAB — PROTIME-INR: Protime: 24 Seconds — ABNORMAL HIGH (ref 10.6–13.4)

## 2011-01-04 NOTE — Discharge Summary (Signed)
Travis Morgan, Travis Morgan               ACCOUNT NO.:  [id number]   MEDICAL RECORD NO.:  [id number]          PATIENT TYPE:  INP   LOCATION:  5705                         FACILITY:  [facility]   PHYSICIAN:  Wayne A. Sheffield Slider, M.D.    DATE OF BIRTH:  Aug 08, 1951   DATE OF ADMISSION:  03/14/2005  DATE OF DISCHARGE:  03/19/2005                                 DISCHARGE SUMMARY   PRIMARY CARE PHYSICIAN:  [facility].   CONSULTATIONS:  None.   DISCHARGE DIAGNOSES:  1.  Bilateral pulmonary embolism.  2.  Chronic back pain.   PROCEDURE:  Abdominal pelvic CT on March 14, 2005 showing possible  obstruction and bilateral pulmonary embolism at the bases of the lungs.  Chest CT on March 15, 2005 showing bilateral pulmonary embolism on the bases.   DISCHARGE MEDICATIONS:  1.  Senokot S.  2.  Coumadin 7.5 mg daily.  3.  Tylenol No. 3 q.4h. p.r.n. pain.   HOSPITAL COURSE:  The patient was admitted on March 14, 2005 after coming in  with complaints of abdominal pain, constipation, and back pain. The patient  was noted to have on abdominal CT lower lung bilateral pulmonary embolisms.  When questioned more regarding these, he admits that in the last week or two  he has had some shortness of breath and chest pain worse on inspiration,  although it seems to have resolved for him at the current time. Nonetheless,  we admitted the patient for bilateral pulmonary embolus. We started him on  Lovenox and Coumadin.   On March 18, 2005, the patient's INR was up to 2.0. He was discontinued on  March 19, 2005 on a Coumadin dose of 7.5 mg p.o. daily and after his fifth  day of Lovenox.   Admission labs shows white count 8.2, hemoglobin 15.9, hematocrit 47.3,  platelets 238,000. Creatinine 1.1. ESR 14. PSA 0.7. Sodium 134, potassium  4.7, chloride 102, BUN 12, glucose 94. Venous pH 7.389, bicarbonate 28.4,  pCO2 47.2. Hematocrit 52, hemoglobin 17.7 via I-stat.   Discharge labs showed a CBC of 3.5, hemoglobin 14.6,  hematocrit 42.7,  platelets 206,000. Sodium 135, potassium 4.2, chloride 103, CO2 27, BUN 9,  creatinine 0.9, glucose 99.   DISPOSITION:  1.  Bilateral DVT's:  The patient was started on Lovenox x5 days and      discontinued with Coumadin and INR 2.0 on March 18, 2005.  2.  Back pain:  This was well controlled with Tylenol No. 3.  3.  Constipation:  This was well controlled on Senokot S.   FOLLOW UP:  The patient will follow up this week I believe on Wednesday with  [facility], although I do not have his exact appointment with me right now.      Clifton Custard   AL/MEDQ  D:  03/19/2005  T:  03/19/2005  Job:  [id number]   cc:   [facility]  [phone]

## 2011-01-04 NOTE — H&P (Signed)
NAMEGEORGIA, BARIA               ACCOUNT NO.:  [id number]   MEDICAL RECORD NO.:  [id number]          PATIENT TYPE:  INP   LOCATION:  5705                         FACILITY:  [facility]   PHYSICIAN:  Asencion Partridge, M.D.     DATE OF BIRTH:  09/17/1950   DATE OF ADMISSION:  03/14/2005  DATE OF DISCHARGE:                                HISTORY & PHYSICAL   CHIEF COMPLAINT:  Pulmonary embolism.   HISTORY OF PRESENT ILLNESS:  This is a 60 year old African/American male who  presented to the emergency department from [facility] for evaluation of  abdominal and back pain, and was incidentally found to have a significant  bilateral pulmonary embolism.  The patient is a very poor historian, but it  seems that he developed a left lower extremity swelling and pain up to the  calf level about two to three weeks ago, and was unable to ambulate on his  leg.  He denied any history of trauma to the leg or any other body part or  prolonged immobility; however, it does seem that he may have been laid up in  bed for awhile with back pain at some point in the last month.  He now  complains of a productive cough and some pain with inspiration.  He denies  hemoptysis or shortness of breath.  He also denies nausea, vomiting, fever,  chills, hematemesis, hematochezia and dysuria.  He has complained of some  low back pain and constipation x4-5 days.   PAST MEDICAL HISTORY:  1.  CVA in 2004.  2.  Mild mental retardation.  3.  Chronic back pain.   MEDICATIONS:  The patient does not have them with him or know his medicines,  but he thinks he may be on Neurontin and some other pain medicine for his  back.   ALLERGIES:  No known drug allergies.   PAST SURGICAL HISTORY:  A hernia repair.   SOCIAL HISTORY:  The patient is on disability, secondary to his CVA.  He  lives with his wife and nephew.  He has been smoking one pack per day for 35  years.  He drinks approximately six beers a night, not consistently.   His  last drink was two days ago.   FAMILY HISTORY:  His mother died at age 82 of diabetes.  His father with  diabetes and hypertension.  A sister also with diabetes.   PHYSICAL EXAMINATION:  VITAL SIGNS:  Temperature 97.2 degrees, blood  pressure 146-156/83-94, pulse 70's to 80's, respirations 18-24.  Saturation  96% to 100% on room air.  GENERAL:  He is alert and oriented x3.  Affect:  A poor historian and has  difficulty answering questions.  HEENT:  Head normocephalic and atraumatic.  Pupils equal, round, reactive to  light and accommodation.  Extraocular movements intact.  Conjunctivae  injected bilaterally.  Dry mucous membranes.  Oropharynx clear without  erythema or exudate.  NODES:  No cervical or axillary adenopathy.  CARDIOVASCULAR:  A regular rate and rhythm.  S1 and S2 present.  No S3 or  S4.  No murmurs, rubs  or gallops.  EXTREMITIES:  Pulses 2+ in the extremities.  No clubbing, cyanosis or edema.  LUNGS:  Clear to auscultation bilaterally.  The patient is splinting,  however, and has some decreased air movement.  ABDOMEN:  Normoactive bowel sounds, soft, nontender, non-distended.  No  rebound or guarding, no hepatosplenomegaly.  MUSCULOSKELETAL:  No tenderness or __________.  We are unable to reproduce  his back pain.  No joint swelling.  Does have a full range of motion in all  limbs as well as neck and lumbar and thoracic spine.  NEUROLOGIC:  Cranial nerves II-XII  grossly intact.  Right proximal lower  extremity strength is 4/5, otherwise strength is 5/5 throughout.  Normal  sensation throughout.  The patient ambulates with a cane.   LABORATORY DATA:  White blood count 8.2, hemoglobin 15.9, hematocrit 47.3,  platelets 238.  Sodium 134, potassium 4.7, chloride 102, bicarbonate 28.5,  BUN 12, creatinine 1.1, glucose 94.   An abdomen/pelvic CT:  The patient had a right inguinal hernia and  significant bilateral pulmonary embolisms.  Abdominal x-ray:  Dilatation of   the right colon and proximal distal small bowel.  No obstruction in the  small bowel.  Right inguinal hernia present.   ASSESSMENT/PLAN:  1.  This is a 60 year old with bilateral pulmonary embolus:  The patient      will be started on Lovenox and Coumadin, and continued on Lovenox until      his INR is therapeutic between 2-3.  Because of the pulmonary embolus,      he is currently idiopathic and he will need Coumadin therapy for six      months.  We will also recommend that he has cancer screening updated by      the two primary care physicians.  He is currently very stable and his      ABG and O2 saturations are normal and he is not having any difficulty      breathing.  We will continue to monitor him on the floor.  2.  Hypertension:  His blood pressures are slightly elevated.  Will monitor      these longer before starting an antihypertensive.  3.  Constipation:  No evidence of bowel obstruction on the KV or CT.  We      will give him Colace 100 mg b.i.d. and follow.  4.  Back pain:  The patient seems to have a history of chronic back pain.      Will give Tylenol #3 for pain and try to get his medical records      tomorrow from his primary care physician.  5.  Fluids/Electrolytes/Nutrition:  We will start D-5 half normal saline at      125 with 20 mEq of Kay-Ciel.  He will be on a regular diet.      Krist   KS/MEDQ  D:  03/15/2005  T:  03/15/2005  Job:  [id number]

## 2011-01-15 ENCOUNTER — Encounter (HOSPITAL_BASED_OUTPATIENT_CLINIC_OR_DEPARTMENT_OTHER): Payer: Medicare HMO | Admitting: Oncology

## 2011-01-15 ENCOUNTER — Other Ambulatory Visit (HOSPITAL_COMMUNITY): Payer: Self-pay | Admitting: Oncology

## 2011-01-15 DIAGNOSIS — Z86718 Personal history of other venous thrombosis and embolism: Secondary | ICD-10-CM

## 2011-01-15 DIAGNOSIS — Z7901 Long term (current) use of anticoagulants: Secondary | ICD-10-CM

## 2011-01-15 LAB — PROTIME-INR: Protime: 20.4 Seconds — ABNORMAL HIGH (ref 10.6–13.4)

## 2011-02-12 ENCOUNTER — Other Ambulatory Visit (HOSPITAL_COMMUNITY): Payer: Self-pay | Admitting: Oncology

## 2011-02-12 ENCOUNTER — Encounter (HOSPITAL_BASED_OUTPATIENT_CLINIC_OR_DEPARTMENT_OTHER): Payer: Medicare HMO | Admitting: Oncology

## 2011-02-12 DIAGNOSIS — Z86718 Personal history of other venous thrombosis and embolism: Secondary | ICD-10-CM

## 2011-02-12 DIAGNOSIS — Z7901 Long term (current) use of anticoagulants: Secondary | ICD-10-CM

## 2011-02-12 LAB — PROTIME-INR: Protime: 31.2 Seconds — ABNORMAL HIGH (ref 10.6–13.4)

## 2011-03-12 ENCOUNTER — Encounter (HOSPITAL_BASED_OUTPATIENT_CLINIC_OR_DEPARTMENT_OTHER): Payer: Medicare HMO | Admitting: Oncology

## 2011-03-12 ENCOUNTER — Other Ambulatory Visit (HOSPITAL_COMMUNITY): Payer: Self-pay | Admitting: Oncology

## 2011-03-12 DIAGNOSIS — Z86718 Personal history of other venous thrombosis and embolism: Secondary | ICD-10-CM

## 2011-03-12 DIAGNOSIS — Z7901 Long term (current) use of anticoagulants: Secondary | ICD-10-CM

## 2011-03-12 LAB — PROTIME-INR
INR: 3.1 (ref 2.00–3.50)
Protime: 37.2 Seconds — ABNORMAL HIGH (ref 10.6–13.4)

## 2011-04-11 ENCOUNTER — Other Ambulatory Visit (HOSPITAL_COMMUNITY): Payer: Self-pay | Admitting: Oncology

## 2011-04-11 ENCOUNTER — Encounter (HOSPITAL_BASED_OUTPATIENT_CLINIC_OR_DEPARTMENT_OTHER): Payer: Medicare HMO | Admitting: Oncology

## 2011-04-11 DIAGNOSIS — Z7901 Long term (current) use of anticoagulants: Secondary | ICD-10-CM

## 2011-04-11 DIAGNOSIS — Z86718 Personal history of other venous thrombosis and embolism: Secondary | ICD-10-CM

## 2011-04-11 LAB — PROTIME-INR
INR: 3.2 (ref 2.00–3.50)
Protime: 38.4 Seconds — ABNORMAL HIGH (ref 10.6–13.4)

## 2011-04-23 ENCOUNTER — Other Ambulatory Visit (HOSPITAL_COMMUNITY): Payer: Self-pay | Admitting: Oncology

## 2011-04-23 ENCOUNTER — Encounter (HOSPITAL_BASED_OUTPATIENT_CLINIC_OR_DEPARTMENT_OTHER): Payer: Medicare HMO | Admitting: Oncology

## 2011-04-23 DIAGNOSIS — Z86718 Personal history of other venous thrombosis and embolism: Secondary | ICD-10-CM

## 2011-04-23 DIAGNOSIS — Z7901 Long term (current) use of anticoagulants: Secondary | ICD-10-CM

## 2011-04-23 DIAGNOSIS — Z79899 Other long term (current) drug therapy: Secondary | ICD-10-CM

## 2011-04-23 LAB — COMPREHENSIVE METABOLIC PANEL
ALT: 19 U/L (ref 0–53)
CO2: 26 mEq/L (ref 19–32)
Calcium: 9.1 mg/dL (ref 8.4–10.5)
Chloride: 104 mEq/L (ref 96–112)
Creatinine, Ser: 1.15 mg/dL (ref 0.50–1.35)
Glucose, Bld: 93 mg/dL (ref 70–99)

## 2011-04-23 LAB — CBC WITH DIFFERENTIAL/PLATELET
BASO%: 0.6 % (ref 0.0–2.0)
Basophils Absolute: 0 10*3/uL (ref 0.0–0.1)
Eosinophils Absolute: 0.2 10*3/uL (ref 0.0–0.5)
HCT: 43.6 % (ref 38.4–49.9)
HGB: 14.7 g/dL (ref 13.0–17.1)
MCHC: 33.8 g/dL (ref 32.0–36.0)
MONO#: 0.3 10*3/uL (ref 0.1–0.9)
NEUT#: 2.9 10*3/uL (ref 1.5–6.5)
NEUT%: 58.2 % (ref 39.0–75.0)
WBC: 4.9 10*3/uL (ref 4.0–10.3)
lymph#: 1.6 10*3/uL (ref 0.9–3.3)

## 2011-04-23 LAB — LACTATE DEHYDROGENASE: LDH: 136 U/L (ref 94–250)

## 2011-04-23 LAB — PROTIME-INR

## 2011-06-25 ENCOUNTER — Other Ambulatory Visit: Payer: Medicare HMO | Admitting: Lab

## 2011-07-23 ENCOUNTER — Telehealth: Payer: Self-pay

## 2011-07-23 ENCOUNTER — Other Ambulatory Visit (HOSPITAL_BASED_OUTPATIENT_CLINIC_OR_DEPARTMENT_OTHER): Payer: Medicare HMO | Admitting: Lab

## 2011-07-23 ENCOUNTER — Other Ambulatory Visit: Payer: Self-pay

## 2011-07-23 ENCOUNTER — Other Ambulatory Visit (HOSPITAL_COMMUNITY): Payer: Self-pay | Admitting: Oncology

## 2011-07-23 DIAGNOSIS — I82890 Acute embolism and thrombosis of other specified veins: Secondary | ICD-10-CM

## 2011-07-23 DIAGNOSIS — Z7901 Long term (current) use of anticoagulants: Secondary | ICD-10-CM

## 2011-07-23 DIAGNOSIS — Z86718 Personal history of other venous thrombosis and embolism: Secondary | ICD-10-CM

## 2011-07-23 NOTE — Telephone Encounter (Signed)
S/w wife PT/INR is 2.2 and pt to continue taking coumadin 6mg  daily. Schedulers will call with next months appt.

## 2011-07-26 ENCOUNTER — Telehealth: Payer: Self-pay | Admitting: Oncology

## 2011-07-26 NOTE — Telephone Encounter (Signed)
S/w the pt's wife and she is aware of the lab appt in jan

## 2011-08-05 ENCOUNTER — Other Ambulatory Visit (HOSPITAL_COMMUNITY): Payer: Self-pay | Admitting: Oncology

## 2011-08-05 DIAGNOSIS — I749 Embolism and thrombosis of unspecified artery: Secondary | ICD-10-CM

## 2011-08-23 ENCOUNTER — Other Ambulatory Visit (HOSPITAL_BASED_OUTPATIENT_CLINIC_OR_DEPARTMENT_OTHER): Payer: Medicare HMO | Admitting: Lab

## 2011-08-23 ENCOUNTER — Encounter: Payer: Self-pay | Admitting: Medical Oncology

## 2011-08-23 ENCOUNTER — Telehealth: Payer: Self-pay | Admitting: Medical Oncology

## 2011-08-23 ENCOUNTER — Other Ambulatory Visit: Payer: Self-pay | Admitting: Oncology

## 2011-08-23 DIAGNOSIS — I82402 Acute embolism and thrombosis of unspecified deep veins of left lower extremity: Secondary | ICD-10-CM | POA: Insufficient documentation

## 2011-08-23 DIAGNOSIS — Z7901 Long term (current) use of anticoagulants: Secondary | ICD-10-CM

## 2011-08-23 DIAGNOSIS — I82890 Acute embolism and thrombosis of other specified veins: Secondary | ICD-10-CM

## 2011-08-23 DIAGNOSIS — Z5181 Encounter for therapeutic drug level monitoring: Secondary | ICD-10-CM

## 2011-08-23 DIAGNOSIS — I2699 Other pulmonary embolism without acute cor pulmonale: Secondary | ICD-10-CM

## 2011-08-23 LAB — PROTIME-INR: Protime: 26.4 Seconds — ABNORMAL HIGH (ref 10.6–13.4)

## 2011-08-23 NOTE — Telephone Encounter (Signed)
I called pt and left a message per Dr. Arline Asp to continue his coumadin 6 mg daily. He does not have an further appointments and we will call him next week for lab and follow up appointments.

## 2011-08-23 NOTE — Progress Notes (Signed)
Per Dr. Arline Asp continue 6 mg of coumadin. We will need to make him new appointments for labs and MD follow up.

## 2011-08-26 ENCOUNTER — Telehealth: Payer: Self-pay | Admitting: Oncology

## 2011-08-26 NOTE — Telephone Encounter (Signed)
lmonvm for pt re appts for 2/4, 3/4, and 4/4. Schedule for feb thru April mailed today.

## 2011-09-23 ENCOUNTER — Other Ambulatory Visit (HOSPITAL_BASED_OUTPATIENT_CLINIC_OR_DEPARTMENT_OTHER): Payer: Medicare HMO | Admitting: Lab

## 2011-09-23 DIAGNOSIS — I2699 Other pulmonary embolism without acute cor pulmonale: Secondary | ICD-10-CM

## 2011-09-23 DIAGNOSIS — I82402 Acute embolism and thrombosis of unspecified deep veins of left lower extremity: Secondary | ICD-10-CM

## 2011-09-23 LAB — PROTIME-INR
INR: 1.5 — ABNORMAL LOW (ref 2.00–3.50)
Protime: 18 Seconds — ABNORMAL HIGH (ref 10.6–13.4)

## 2011-09-24 ENCOUNTER — Telehealth: Payer: Self-pay | Admitting: Medical Oncology

## 2011-09-24 NOTE — Telephone Encounter (Signed)
Called pt and left a message regarding PT/INR. I asked if he had missed any doses of coumadin. If so to resume 6 mg. Lab and MD 10/23/11.

## 2011-10-21 ENCOUNTER — Other Ambulatory Visit: Payer: Medicare Other | Admitting: Lab

## 2011-10-21 ENCOUNTER — Encounter: Payer: Self-pay | Admitting: Oncology

## 2011-10-21 ENCOUNTER — Ambulatory Visit (HOSPITAL_BASED_OUTPATIENT_CLINIC_OR_DEPARTMENT_OTHER): Payer: Medicare Other | Admitting: Oncology

## 2011-10-21 DIAGNOSIS — Z5181 Encounter for therapeutic drug level monitoring: Secondary | ICD-10-CM

## 2011-10-21 DIAGNOSIS — I82402 Acute embolism and thrombosis of unspecified deep veins of left lower extremity: Secondary | ICD-10-CM

## 2011-10-21 DIAGNOSIS — I82409 Acute embolism and thrombosis of unspecified deep veins of unspecified lower extremity: Secondary | ICD-10-CM

## 2011-10-21 DIAGNOSIS — I2699 Other pulmonary embolism without acute cor pulmonale: Secondary | ICD-10-CM

## 2011-10-21 DIAGNOSIS — Z7901 Long term (current) use of anticoagulants: Secondary | ICD-10-CM

## 2011-10-21 LAB — COMPREHENSIVE METABOLIC PANEL
AST: 18 U/L (ref 0–37)
Albumin: 3.9 g/dL (ref 3.5–5.2)
BUN: 14 mg/dL (ref 6–23)
Calcium: 9.2 mg/dL (ref 8.4–10.5)
Chloride: 107 mEq/L (ref 96–112)
Glucose, Bld: 85 mg/dL (ref 70–99)
Potassium: 4.3 mEq/L (ref 3.5–5.3)
Sodium: 138 mEq/L (ref 135–145)
Total Protein: 7 g/dL (ref 6.0–8.3)

## 2011-10-21 LAB — CBC WITH DIFFERENTIAL/PLATELET
Basophils Absolute: 0 10*3/uL (ref 0.0–0.1)
EOS%: 3.6 % (ref 0.0–7.0)
Eosinophils Absolute: 0.1 10*3/uL (ref 0.0–0.5)
HGB: 14.5 g/dL (ref 13.0–17.1)
NEUT#: 2 10*3/uL (ref 1.5–6.5)
RDW: 12.3 % (ref 11.0–14.6)
lymph#: 1.4 10*3/uL (ref 0.9–3.3)

## 2011-10-21 LAB — PROTIME-INR: INR: 1.9 — ABNORMAL LOW (ref 2.00–3.50)

## 2011-10-21 NOTE — Progress Notes (Signed)
PROBLEM LIST: 1. History of bilateral pulmonary emboli in July 2006 without obvious     cause.  A hypercoagulable workup at that time was     negative.   2. Left leg deep vein thrombosis by Doppler study in     August 2007 when the patient either had a subtherapeutic PT/INR or     was off Coumadin.   3. History of right incarcerated inguinal     hernia treated with surgery on 11/29/2009.   4. History of cervical     spine surgery by Dr. Eliane Decree in 2003.   5. History of     cholesterol gallstones seen on CT scan 03/14/2005.   6. Anticoagulation therapy with Coumadin.  MEDICATIONS: 1. Tylenol for musculoskeletal pain.   2. Coumadin currently 6 mg     daily.  HISTORY:  I saw Jahkeem Kurka today for followup of his pulmonary emboli and DVT without obvious etiology.  Ananda is currently on Coumadin which we plan for long-term.  He was last seen by [place] on 04/23/2011.  He has been fairly compliant with his Coumadin.  He apparently ran out of Coumadin in late January or early February, accounting for an INR of 1.50.  The rest of time, he has had therapeutic INRs.  He denies any bleeding/bruising problems, any other medical problems, or evidence of thrombotic events.  All in all, he seems to be doing fairly well except for musculoskeletal pain for which he takes Tylenol.  Tylenol may be causing some unevenness in his anticoagulation and fluctuation in his PT/INR.  PHYSICAL EXAM:  General:  There is little change.  Lavar is now 61 years old.  Vital Signs:  Weight is stable 186.5 pounds, height 5 feet 6 inches, body surface area 1.98 sq m.  Blood pressure today 157/97 in the right arm.  Shonta was informed of his elevated blood pressure.  Other vital signs are normal.  Blood pressures have been borderline in the past.  HEENT:  There is marked a conjunctiva will injection bilaterally as before.  Mouth and pharynx are benign.  No peripheral adenopathy palpable.  Heart/lungs:   Normal.  Abdomen:  Benign with no organomegaly or masses palpable.  Extremities:  No peripheral edema or calf tenderness.  No petechiae or purpura.  Skin:  Rather oily.  Neurologic: Nonfocal although Felice does walk with a shuffling gait which is not new.  This may be due to a cervical myelopathy.  LABORATORY DATA:  Today, white count 3.9, ANC 2.0, hemoglobin 14.5, hematocrit 43.5, platelets 217,000.  Pro time today was 22.8 with an INR of 1.90.  On 09/23/2011, INR was 1.5.  On 08/23/2011, INR was 2.2.  On 07/23/2011, INR was 2.2.  On 04/23/2011, INR was 2.6.  Chemistries from 04/23/2011 were normal.  We do have a hypercoagulable workup carried out in the summer of 2007.  Workup was negative except for an elevated D- dimer.  IMAGING STUDIES: 1. CT angiogram of the chest with contrast on 03/15/2005 showed     bilateral central and lower lobe segmental pulmonary emboli.  There     was minimal consolidation/atelectasis in the posterior basal     segments of both lower lobes as well as a tiny left pleural     effusion.   2. CT angiogram of the chest on 03/14/2006 showed no     pulmonary emboli or acute findings.   3. Chest x-ray, two-view,     from 11/28/2009 showed no acute or  active cardiopulmonary process.  IMPRESSION AND PLAN:  Zaide continues to do well with no obvious changes in his condition; specifically, no thrombotic or bleeding/bruising episodes.  We will continue with his standard dose of Coumadin 6 mg daily.  The patient was encouraged to try to be compliant, not to run out of his Coumadin as he has in the past.  I think his compliance has improved.  We need to remember that the Tylenol may be causing some fluctuations in his PT/INR. We will continue to check PT/INRs every 4 weeks and plan to see him in about 7 months which will be early October 2013.  We will check CBC, chemistries, and pro time.    ______________________________ Samul Dada, M.D. DSM/MEDQ   D:  10/21/2011  T:  10/21/2011  Job:  [id number]

## 2011-10-21 NOTE — Progress Notes (Signed)
This office note has been dictated.  #[id number]

## 2011-11-20 ENCOUNTER — Other Ambulatory Visit (HOSPITAL_COMMUNITY): Payer: Self-pay | Admitting: Oncology

## 2011-11-20 DIAGNOSIS — I2699 Other pulmonary embolism without acute cor pulmonale: Secondary | ICD-10-CM

## 2011-11-21 ENCOUNTER — Other Ambulatory Visit (HOSPITAL_BASED_OUTPATIENT_CLINIC_OR_DEPARTMENT_OTHER): Payer: Medicare Other | Admitting: Lab

## 2011-11-21 ENCOUNTER — Other Ambulatory Visit: Payer: Self-pay

## 2011-11-21 ENCOUNTER — Telehealth: Payer: Self-pay

## 2011-11-21 DIAGNOSIS — I82402 Acute embolism and thrombosis of unspecified deep veins of left lower extremity: Secondary | ICD-10-CM

## 2011-11-21 DIAGNOSIS — Z7901 Long term (current) use of anticoagulants: Secondary | ICD-10-CM

## 2011-11-21 DIAGNOSIS — I2699 Other pulmonary embolism without acute cor pulmonale: Secondary | ICD-10-CM

## 2011-11-21 DIAGNOSIS — Z5181 Encounter for therapeutic drug level monitoring: Secondary | ICD-10-CM

## 2011-11-21 LAB — PROTIME-INR: INR: 2.1 (ref 2.00–3.50)

## 2011-11-21 NOTE — Telephone Encounter (Signed)
S/w pt to continue taking coumadin 6 mg daily. He stated he takes 2 mg 3 times a day. Told pt we will call him with next lab appt.

## 2011-11-22 ENCOUNTER — Other Ambulatory Visit: Payer: Self-pay

## 2011-11-22 ENCOUNTER — Telehealth: Payer: Self-pay | Admitting: Oncology

## 2011-11-22 DIAGNOSIS — I2699 Other pulmonary embolism without acute cor pulmonale: Secondary | ICD-10-CM

## 2011-11-22 MED ORDER — WARFARIN SODIUM 2 MG PO TABS: 6.0000 mg | ORAL_TABLET | Freq: Every day | ORAL | Status: DC

## 2011-11-22 NOTE — Telephone Encounter (Signed)
s/w pt and he is aware of his 5/2 appt and will get a sch at that time  aom

## 2011-12-19 ENCOUNTER — Other Ambulatory Visit (HOSPITAL_BASED_OUTPATIENT_CLINIC_OR_DEPARTMENT_OTHER): Payer: Medicare HMO | Admitting: Lab

## 2011-12-19 ENCOUNTER — Telehealth: Payer: Self-pay

## 2011-12-19 DIAGNOSIS — I2699 Other pulmonary embolism without acute cor pulmonale: Secondary | ICD-10-CM

## 2011-12-19 DIAGNOSIS — I82402 Acute embolism and thrombosis of unspecified deep veins of left lower extremity: Secondary | ICD-10-CM

## 2011-12-19 LAB — PROTIME-INR
INR: 3.1 (ref 2.00–3.50)
Protime: 37.2 Seconds — ABNORMAL HIGH (ref 10.6–13.4)

## 2011-12-19 NOTE — Telephone Encounter (Signed)
S/w pt to continue warfarin at 6mg  daily (takes 3 2mg  tablets) and next appt 5/30. Pt expressed understanding.

## 2012-01-16 ENCOUNTER — Telehealth: Payer: Self-pay

## 2012-01-16 ENCOUNTER — Other Ambulatory Visit (HOSPITAL_BASED_OUTPATIENT_CLINIC_OR_DEPARTMENT_OTHER): Payer: Medicare HMO | Admitting: Lab

## 2012-01-16 DIAGNOSIS — I2699 Other pulmonary embolism without acute cor pulmonale: Secondary | ICD-10-CM

## 2012-01-16 DIAGNOSIS — I82402 Acute embolism and thrombosis of unspecified deep veins of left lower extremity: Secondary | ICD-10-CM

## 2012-01-16 LAB — PROTIME-INR

## 2012-01-16 NOTE — Telephone Encounter (Signed)
S/w wife that INR is OK and no change in coumadin dose, pt next lab appt in 1 month. She expressed understanding.

## 2012-02-13 ENCOUNTER — Other Ambulatory Visit: Payer: Medicare Other | Admitting: Lab

## 2012-02-17 ENCOUNTER — Other Ambulatory Visit: Payer: Medicare HMO | Admitting: Lab

## 2012-02-17 ENCOUNTER — Telehealth: Payer: Self-pay

## 2012-02-17 DIAGNOSIS — I2699 Other pulmonary embolism without acute cor pulmonale: Secondary | ICD-10-CM

## 2012-02-17 DIAGNOSIS — I82402 Acute embolism and thrombosis of unspecified deep veins of left lower extremity: Secondary | ICD-10-CM

## 2012-02-17 LAB — PROTIME-INR: INR: 2.5 (ref 2.00–3.50)

## 2012-02-17 NOTE — Telephone Encounter (Signed)
S/w wife. INR therapeutic. Pt to continue coumadin 6 mg daily. Recheck on 7/25.

## 2012-03-12 ENCOUNTER — Other Ambulatory Visit (HOSPITAL_BASED_OUTPATIENT_CLINIC_OR_DEPARTMENT_OTHER): Payer: Medicare HMO | Admitting: Lab

## 2012-03-12 DIAGNOSIS — I82409 Acute embolism and thrombosis of unspecified deep veins of unspecified lower extremity: Secondary | ICD-10-CM

## 2012-03-12 DIAGNOSIS — I2699 Other pulmonary embolism without acute cor pulmonale: Secondary | ICD-10-CM

## 2012-03-12 DIAGNOSIS — I82402 Acute embolism and thrombosis of unspecified deep veins of left lower extremity: Secondary | ICD-10-CM

## 2012-03-13 ENCOUNTER — Telehealth: Payer: Self-pay

## 2012-03-13 NOTE — Telephone Encounter (Signed)
S/w pt that DSM does not want him to change his coumadin dose of 6mg  daily. Reaffirmed next appt 8/22 at 215pm

## 2012-03-13 NOTE — Telephone Encounter (Signed)
S/w pt: he has been compliant taking 6mg  coumadin daily, no missed doses, no change in diet, no change in medication, no illness. Will speak w/DSM and call pt back if any changes in coumadin schedule

## 2012-04-07 ENCOUNTER — Other Ambulatory Visit: Payer: Self-pay | Admitting: Oncology

## 2012-04-07 DIAGNOSIS — I2699 Other pulmonary embolism without acute cor pulmonale: Secondary | ICD-10-CM

## 2012-04-07 DIAGNOSIS — I82409 Acute embolism and thrombosis of unspecified deep veins of unspecified lower extremity: Secondary | ICD-10-CM

## 2012-04-09 ENCOUNTER — Telehealth: Payer: Self-pay | Admitting: Medical Oncology

## 2012-04-09 ENCOUNTER — Other Ambulatory Visit (HOSPITAL_BASED_OUTPATIENT_CLINIC_OR_DEPARTMENT_OTHER): Payer: Medicare HMO | Admitting: Lab

## 2012-04-09 DIAGNOSIS — I2699 Other pulmonary embolism without acute cor pulmonale: Secondary | ICD-10-CM

## 2012-04-09 DIAGNOSIS — I82409 Acute embolism and thrombosis of unspecified deep veins of unspecified lower extremity: Secondary | ICD-10-CM

## 2012-04-09 DIAGNOSIS — I82402 Acute embolism and thrombosis of unspecified deep veins of left lower extremity: Secondary | ICD-10-CM

## 2012-04-09 LAB — PROTIME-INR
INR: 2.8 (ref 2.00–3.50)
Protime: 33.6 Seconds — ABNORMAL HIGH (ref 10.6–13.4)

## 2012-04-09 NOTE — Telephone Encounter (Signed)
I called pt to let him know that his PT/INR in range. Per Dr. Arline Asp continue with 6 mg of coumadin. He voiced understanding.

## 2012-05-07 ENCOUNTER — Other Ambulatory Visit: Payer: Medicare Other | Admitting: Lab

## 2012-05-07 ENCOUNTER — Ambulatory Visit: Payer: Medicare Other | Admitting: Oncology

## 2012-05-07 ENCOUNTER — Telehealth: Payer: Self-pay | Admitting: Oncology

## 2012-05-07 ENCOUNTER — Other Ambulatory Visit: Payer: Self-pay

## 2012-05-07 NOTE — Telephone Encounter (Signed)
pt called and l/m to r/s todays appts to 10/15-next avail    aom

## 2012-06-02 ENCOUNTER — Other Ambulatory Visit (HOSPITAL_BASED_OUTPATIENT_CLINIC_OR_DEPARTMENT_OTHER): Payer: Medicare HMO | Admitting: Lab

## 2012-06-02 ENCOUNTER — Other Ambulatory Visit: Payer: Self-pay | Admitting: Medical Oncology

## 2012-06-02 ENCOUNTER — Ambulatory Visit (HOSPITAL_BASED_OUTPATIENT_CLINIC_OR_DEPARTMENT_OTHER): Payer: Medicare HMO | Admitting: Oncology

## 2012-06-02 ENCOUNTER — Encounter: Payer: Self-pay | Admitting: Oncology

## 2012-06-02 VITALS — BP 180/89 | HR 64 | Temp 98.0°F | Resp 20 | Ht 71.0 in | Wt 190.2 lb

## 2012-06-02 DIAGNOSIS — I82409 Acute embolism and thrombosis of unspecified deep veins of unspecified lower extremity: Secondary | ICD-10-CM

## 2012-06-02 DIAGNOSIS — I10 Essential (primary) hypertension: Secondary | ICD-10-CM

## 2012-06-02 DIAGNOSIS — Z86718 Personal history of other venous thrombosis and embolism: Secondary | ICD-10-CM

## 2012-06-02 DIAGNOSIS — I2699 Other pulmonary embolism without acute cor pulmonale: Secondary | ICD-10-CM

## 2012-06-02 DIAGNOSIS — I82402 Acute embolism and thrombosis of unspecified deep veins of left lower extremity: Secondary | ICD-10-CM

## 2012-06-02 DIAGNOSIS — Z86711 Personal history of pulmonary embolism: Secondary | ICD-10-CM

## 2012-06-02 LAB — CBC WITH DIFFERENTIAL/PLATELET
BASO%: 1 % (ref 0.0–2.0)
Eosinophils Absolute: 0.2 10*3/uL (ref 0.0–0.5)
MCHC: 33.8 g/dL (ref 32.0–36.0)
MONO#: 0.5 10*3/uL (ref 0.1–0.9)
NEUT#: 2.7 10*3/uL (ref 1.5–6.5)
RBC: 4.71 10*6/uL (ref 4.20–5.82)
RDW: 12.7 % (ref 11.0–14.6)
WBC: 4.9 10*3/uL (ref 4.0–10.3)

## 2012-06-02 LAB — LACTATE DEHYDROGENASE (CC13): LDH: 171 U/L (ref 125–220)

## 2012-06-02 LAB — COMPREHENSIVE METABOLIC PANEL (CC13)
AST: 20 U/L (ref 5–34)
Albumin: 3.6 g/dL (ref 3.5–5.0)
BUN: 14 mg/dL (ref 7.0–26.0)
Calcium: 9.3 mg/dL (ref 8.4–10.4)
Chloride: 108 mEq/L — ABNORMAL HIGH (ref 98–107)
Creatinine: 1.3 mg/dL (ref 0.7–1.3)
Glucose: 77 mg/dl (ref 70–99)

## 2012-06-02 LAB — PROTIME-INR: INR: 4.3 — ABNORMAL HIGH (ref 2.00–3.50)

## 2012-06-02 MED ORDER — AMLODIPINE BESYLATE 5 MG PO TABS: 5.0000 mg | ORAL_TABLET | Freq: Every day | ORAL | Status: DC

## 2012-06-02 NOTE — Patient Instructions (Signed)
Hold coumadin today and tomorrow;  Restart coumadin at 6 mg per day on Thursday, 10/17. We will be checking your protime every week for the next 3 weeks, then monthly if your protime is OK.   You need to find a primary care doctor to manage your high blood pressure--180/89 today!

## 2012-06-02 NOTE — Progress Notes (Signed)
This office note has been dictated.  #[id number]

## 2012-06-02 NOTE — Telephone Encounter (Signed)
PT weekly times 3, then monthly times 5; labs and appt---about 12/21/12  Left voice message to inform the patient of the new lab only appointments  Mailed out calendars to also inform the patient of the new lab only appointments on 12-2012 lab and md

## 2012-06-03 NOTE — Progress Notes (Signed)
PROBLEM LIST:  1. History of bilateral pulmonary emboli in July 2006 without obvious  cause. A hypercoagulable workup at that time was negative.  2. Left leg deep vein thrombosis by Doppler study in  August 2007 when the patient either had a subtherapeutic PT/INR or  was off Coumadin.  3. History of right incarcerated inguinal hernia treated with surgery on 11/29/2009.  4. History of cervical spine surgery by Dr. Eliane Decree in 2003.  5. History of cholesterol gallstones seen on CT scan 03/14/2005. 6. Hypertension.  7. Anticoagulation therapy with Coumadin.    MEDICATIONS: 1. Tylenol 500 mg twice daily. 2. Norvasc 5 mg daily starting 06/02/2012. 3. Coumadin 6 mg daily.   SMOKING HISTORY:  The patient is a former smoker.  HISTORY:  Travis Morgan was seen today for followup of his history of pulmonary emboli and DVT without obvious etiology.  Pulmonary emboli were diagnosed in late July 2006.  Travis Morgan is currently on 6 mg of Coumadin.  We have no plans to discontinue Coumadin.  Travis Morgan may be a candidate for Pradaxa.  He was last seen by [place] on 10/21/2011.  He has been very compliant with his monitoring.  On 10/21/2011, INR was 1.90. On 11/21/2011, INR was 2.10.  On 12/19/2011, INR was 3.10.  On 01/16/2012, INR was 2.60.  On 02/17/2012, INR was 2.50.  On 03/12/2012, INR was 1.70.  On 04/09/2012, INR was 2.80.  Today the INR was 4.30 with PT of 51.6.  The patient is without bleeding or bruising.  He denies any respiratory problems or leg swelling or pain in the legs.  As stated, he has been quite compliant with his Coumadin and his monitoring.  PHYSICAL EXAMINATION:  There is little change.  Weight today is 190.2 pounds, height 5 feet 11 inches, body surface area 2.08 sq. m.  Blood pressure today 180/89.  Travis Morgan was made aware of his elevated blood pressure.  We prescribed Norvasc for him 5 mg daily.  We also strongly advised that he find a primary care physician to monitor his  blood pressure and adjust his antihypertensive medicine accordingly.  The rest of vital signs were normal.  There remains marked conjunctival injection bilaterally.  Mouth and pharynx benign.  No peripheral adenopathy palpable.  Travis Morgans skin is very oily, particularly around his face. Heart and lungs:  Normal.  Abdomen:  Benign with no organomegaly or masses palpable.  Extremities:  No peripheral edema or calf tenderness. No petechiae or purpura.  There is a cyst over the central forehead. Neurologic:  Nonfocal.  Travis Morgan walks with a shuffling gait.  This is stable.  LABORATORY DATA:  Today, white count 4.9, ANC 2.7, hemoglobin 15.1, hematocrit 44.7, platelets 199,000.  Chemistries were normal.  Albumin was 3.6, BUN 14, creatinine 1.3.  Pro time was 51.6 with an INR of 4.30.  IMAGING STUDIES:  1. CT angiogram of the chest with contrast on 03/15/2005 showed  bilateral central and lower lobe segmental pulmonary emboli. There  was minimal consolidation/atelectasis in the posterior basal  segments of both lower lobes as well as a tiny left pleural  effusion.  2. CT angiogram of the chest on 03/14/2006 showed no  pulmonary emboli or acute findings.  3. Chest x-ray, two-view,  from 11/28/2009 showed no acute or active cardiopulmonary process.   IMPRESSION/PLAN:  Travis Morgan's pro time today was markedly elevated at 51.6 with an INR of 4.30.  Reasons for this are not clear.  I have instructed Travis Morgan to hold his Coumadin  for the next 2 days.  Apparently he took 2 mg of Coumadin this morning and was going to take 4 mg in the evening. I instructed him to take all 6 mg in the evening when he resumes Coumadin, which will be on Thursday, October 17th.  He will hold Coumadin for today and tomorrow.  We will check pro times on a weekly basis for the next 3 weeks.  We would like to check a pro time on October 22nd, 29th, and again on November 5th.  Thereafter we will probably check pro time on a  monthly basis assuming pro times are okay.  If the pro time continues to run high, then we will have to make some adjustment in the Coumadin dose, which has been fairly stable at 6 mg daily now for the last couple of years.  We will plan to see Travis Morgan again in 6 months, which should be around Dec 21, 2012, at which time we will check CBC, chemistries, and pro time.  We arranged for Travis Morgan to be on Norvasc 5 mg daily for his elevated blood pressure.  I have strongly urged him to find a primary care physician.    ______________________________ Samul Dada, M.D. DSM/MEDQ  D:  06/02/2012  T:  06/03/2012  Job:  [id number]

## 2012-06-09 ENCOUNTER — Other Ambulatory Visit (HOSPITAL_BASED_OUTPATIENT_CLINIC_OR_DEPARTMENT_OTHER): Payer: Medicare HMO | Admitting: Lab

## 2012-06-09 ENCOUNTER — Telehealth: Payer: Self-pay

## 2012-06-09 ENCOUNTER — Other Ambulatory Visit: Payer: Medicare HMO | Admitting: Lab

## 2012-06-09 DIAGNOSIS — Z7901 Long term (current) use of anticoagulants: Secondary | ICD-10-CM

## 2012-06-09 DIAGNOSIS — I82409 Acute embolism and thrombosis of unspecified deep veins of unspecified lower extremity: Secondary | ICD-10-CM

## 2012-06-09 DIAGNOSIS — Z5181 Encounter for therapeutic drug level monitoring: Secondary | ICD-10-CM

## 2012-06-09 DIAGNOSIS — I2699 Other pulmonary embolism without acute cor pulmonale: Secondary | ICD-10-CM

## 2012-06-09 LAB — PROTIME-INR

## 2012-06-09 NOTE — Telephone Encounter (Signed)
S/w pt that INR was 2.9. To continue coumadin 6 mg daily. To keep next lab appt 10/29 at 330 pm. Pt stated date and time back.

## 2012-06-16 ENCOUNTER — Other Ambulatory Visit (HOSPITAL_BASED_OUTPATIENT_CLINIC_OR_DEPARTMENT_OTHER): Payer: Medicare HMO | Admitting: Lab

## 2012-06-16 DIAGNOSIS — I2699 Other pulmonary embolism without acute cor pulmonale: Secondary | ICD-10-CM

## 2012-06-16 LAB — PROTIME-INR: Protime: 31.2 Seconds — ABNORMAL HIGH (ref 10.6–13.4)

## 2012-06-17 ENCOUNTER — Telehealth: Payer: Self-pay | Admitting: Medical Oncology

## 2012-06-17 ENCOUNTER — Encounter: Payer: Self-pay | Admitting: Medical Oncology

## 2012-06-17 NOTE — Progress Notes (Signed)
Per Dr. Arline Asp continue 6 mg of coumadin and recheck PT/INR 06/23/12

## 2012-06-17 NOTE — Telephone Encounter (Signed)
Spoke with wife to let her know that pt's PT/INR is good. Per Dr. Arline Asp continue 6 mg of coumadin and we will recheck labs 06/23/12. She voiced understanding.

## 2012-06-23 ENCOUNTER — Other Ambulatory Visit (HOSPITAL_BASED_OUTPATIENT_CLINIC_OR_DEPARTMENT_OTHER): Payer: Medicare HMO | Admitting: Lab

## 2012-06-23 ENCOUNTER — Other Ambulatory Visit: Payer: Self-pay | Admitting: Medical Oncology

## 2012-06-23 DIAGNOSIS — I82409 Acute embolism and thrombosis of unspecified deep veins of unspecified lower extremity: Secondary | ICD-10-CM

## 2012-06-23 DIAGNOSIS — I2699 Other pulmonary embolism without acute cor pulmonale: Secondary | ICD-10-CM

## 2012-06-23 DIAGNOSIS — I82402 Acute embolism and thrombosis of unspecified deep veins of left lower extremity: Secondary | ICD-10-CM

## 2012-06-23 LAB — PROTIME-INR: Protime: 15.6 Seconds — ABNORMAL HIGH (ref 10.6–13.4)

## 2012-06-24 ENCOUNTER — Telehealth: Payer: Self-pay | Admitting: Medical Oncology

## 2012-06-24 ENCOUNTER — Other Ambulatory Visit: Payer: Self-pay | Admitting: Pharmacist

## 2012-06-24 ENCOUNTER — Other Ambulatory Visit: Payer: Self-pay | Admitting: Medical Oncology

## 2012-06-24 DIAGNOSIS — I2699 Other pulmonary embolism without acute cor pulmonale: Secondary | ICD-10-CM | POA: Insufficient documentation

## 2012-06-24 NOTE — Telephone Encounter (Signed)
I called pt and spoke with his wife regarding his PT/INR. I asked her if pt has missed any doses of coumadin. She states that he did miss a dose or two. Per Dr. Arline Asp have him continue the 6 mg daily. She voiced understanding.

## 2012-07-08 ENCOUNTER — Other Ambulatory Visit (HOSPITAL_BASED_OUTPATIENT_CLINIC_OR_DEPARTMENT_OTHER): Payer: Medicare HMO | Admitting: Lab

## 2012-07-08 ENCOUNTER — Ambulatory Visit (HOSPITAL_BASED_OUTPATIENT_CLINIC_OR_DEPARTMENT_OTHER): Payer: Medicare HMO | Admitting: Pharmacist

## 2012-07-08 DIAGNOSIS — I2699 Other pulmonary embolism without acute cor pulmonale: Secondary | ICD-10-CM

## 2012-07-08 DIAGNOSIS — I82409 Acute embolism and thrombosis of unspecified deep veins of unspecified lower extremity: Secondary | ICD-10-CM

## 2012-07-08 DIAGNOSIS — I82402 Acute embolism and thrombosis of unspecified deep veins of left lower extremity: Secondary | ICD-10-CM

## 2012-07-08 LAB — PROTIME-INR: INR: 2.4 (ref 2.00–3.50)

## 2012-07-08 NOTE — Progress Notes (Signed)
INR subtherapeutic (2.4) on 6mg  daily. Pt has been splitting dose into 2mg  in AM and 4mg  in PM.  Since pt has a history of missing doses, instructed pt to take all of his tablets at once in the evening between 5pm and 6pm. This will decrease the chance he will miss a dose, and allow the Coumadin Clinic to make adjustments to his dose or have him hold his dose on the day that we check his INR.  Reviewed importance of compliance, drug/drug and drug/food interactions, goals of therapy, anticipated duration of therapy, adverse effects and bleeding risks associated with Coumadin, and the purpose and intent of the Coumadin Clinic.  Pt communicated understanding. Will have pt continue 6mg  daily since he has been historically stable on this dose.   Recheck INR in 2 weeks with existing lab appt.

## 2012-07-20 ENCOUNTER — Other Ambulatory Visit: Payer: Self-pay | Admitting: Oncology

## 2012-07-23 ENCOUNTER — Ambulatory Visit (HOSPITAL_BASED_OUTPATIENT_CLINIC_OR_DEPARTMENT_OTHER): Payer: Medicare HMO | Admitting: Pharmacist

## 2012-07-23 ENCOUNTER — Other Ambulatory Visit (HOSPITAL_BASED_OUTPATIENT_CLINIC_OR_DEPARTMENT_OTHER): Payer: Medicare HMO | Admitting: Lab

## 2012-07-23 DIAGNOSIS — I82402 Acute embolism and thrombosis of unspecified deep veins of left lower extremity: Secondary | ICD-10-CM

## 2012-07-23 DIAGNOSIS — I82409 Acute embolism and thrombosis of unspecified deep veins of unspecified lower extremity: Secondary | ICD-10-CM

## 2012-07-23 DIAGNOSIS — I2699 Other pulmonary embolism without acute cor pulmonale: Secondary | ICD-10-CM

## 2012-07-23 LAB — PROTIME-INR: INR: 1.7 — ABNORMAL LOW (ref 2.00–3.50)

## 2012-07-23 NOTE — Progress Notes (Signed)
Pt INR below goal at 1.7. Pt states he has eaten more foods this week high in vitamin K than normal. Re-educated patient on the importance of being consistent with his diet, especially his vitamin K intake. Instructed patient to take 1 extra tablet tonight (8mg  total) and then continue taking 6 mg daily. Asked patient to return in 2 weeks, but patient did not want to change his appointment that was already scheduled in January. Pt will return January 6th 2014 at 330pm lab 3:45pm coumadin clinic

## 2012-07-23 NOTE — Patient Instructions (Addendum)
INR low at 1.7 due to eating greens Eat less greens this week Take 1 extra tablet tonight (4 tablets total) Return in 3-4 weeks with scheduled lab appointment 08/24/12

## 2012-07-24 ENCOUNTER — Ambulatory Visit: Payer: Medicare HMO

## 2012-08-20 ENCOUNTER — Other Ambulatory Visit: Payer: Self-pay | Admitting: Oncology

## 2012-08-24 ENCOUNTER — Other Ambulatory Visit (HOSPITAL_BASED_OUTPATIENT_CLINIC_OR_DEPARTMENT_OTHER): Payer: Medicare HMO | Admitting: Lab

## 2012-08-24 ENCOUNTER — Ambulatory Visit (HOSPITAL_BASED_OUTPATIENT_CLINIC_OR_DEPARTMENT_OTHER): Payer: Medicare HMO | Admitting: Pharmacist

## 2012-08-24 DIAGNOSIS — I82409 Acute embolism and thrombosis of unspecified deep veins of unspecified lower extremity: Secondary | ICD-10-CM

## 2012-08-24 DIAGNOSIS — I82402 Acute embolism and thrombosis of unspecified deep veins of left lower extremity: Secondary | ICD-10-CM

## 2012-08-24 DIAGNOSIS — I2699 Other pulmonary embolism without acute cor pulmonale: Secondary | ICD-10-CM

## 2012-08-24 LAB — PROTIME-INR: INR: 1.4 — ABNORMAL LOW (ref 2.00–3.50)

## 2012-08-24 NOTE — Progress Notes (Signed)
INR = 1.4 Pt ran out of Warfarin & has not had any doses since Friday (08/21/12) A refill for his Warfarin has been called in to [facility] but pt has not been to pick them up. Pt has trouble ambulating & has transportation problems.  I have asked Kathrin Penner, social worker to help w/ transportation options.  She will talk w/ pt today after our visit. Pt is unwilling to have follow up of his INR this month.  He has lab on 09/23/12 already scheduled & insists that he come only then for INR.  He understands risk of not having INR checked before then (bleeding vs clotting). Pt will take 8 mg today then 6 mg/day. INR check on 09/23/12 at pt request. Marily Lente, Pharm,.D

## 2012-09-21 ENCOUNTER — Other Ambulatory Visit: Payer: Self-pay | Admitting: Oncology

## 2012-09-23 ENCOUNTER — Ambulatory Visit (HOSPITAL_BASED_OUTPATIENT_CLINIC_OR_DEPARTMENT_OTHER): Payer: Medicare HMO | Admitting: Pharmacist

## 2012-09-23 ENCOUNTER — Other Ambulatory Visit (HOSPITAL_BASED_OUTPATIENT_CLINIC_OR_DEPARTMENT_OTHER): Payer: Medicare HMO | Admitting: Lab

## 2012-09-23 DIAGNOSIS — I82409 Acute embolism and thrombosis of unspecified deep veins of unspecified lower extremity: Secondary | ICD-10-CM

## 2012-09-23 DIAGNOSIS — I82402 Acute embolism and thrombosis of unspecified deep veins of left lower extremity: Secondary | ICD-10-CM

## 2012-09-23 DIAGNOSIS — I2699 Other pulmonary embolism without acute cor pulmonale: Secondary | ICD-10-CM

## 2012-09-23 LAB — PROTIME-INR: INR: 2.4 (ref 2.00–3.50)

## 2012-09-23 MED ORDER — WARFARIN SODIUM 2 MG PO TABS: 6.0000 mg | ORAL_TABLET | Freq: Every day | ORAL | Status: DC

## 2012-09-23 NOTE — Progress Notes (Signed)
INR slightly subtherapeutic today (2.4) on 6mg  daily.  No changes in meds or diet.  No problems with bleeding.   Pt frustrated because his Coumadin prescriptions have never had refills ordered on them.  He has to call back each month, and feels hassled.  Although a Rx was already sent to his [facility] pharmacy this month, I went ahead and ordered a script for him with 5 refills.  Pt seemed more satisfied with this arrangement.   Pt also requests a letter from Dr. Arline Asp excusing him from jury duty since he has a lab appt scheduled at that time.  Dr. Arline Asp declined, as being on Coumadin would not preclude him from serving on jury duty.  This was explained to the patient, and he reasonably requested that the March lab appointment be moved to another time - which we can accommodate.  Also advised patient that he may be a good candidate for Pradaxa, an alternate oral anticoagulation medication.  Pt seem amenable to switching, and plans to discuss a possible switch to Pradaxa with Dr. Arline Asp during his next MD appt in May.  Since patient's INR has been therapeutic or close to therapeutic when he is compliant with this dose, will continue current dose and recheck INR in 8 weeks with already scheduled lab appt.   Will cancel March lab appt since he will be reporting for jury duty.

## 2012-09-24 ENCOUNTER — Encounter: Payer: Self-pay | Admitting: Oncology

## 2012-09-24 NOTE — Progress Notes (Signed)
Consider switching to Xarelto.

## 2012-09-25 ENCOUNTER — Other Ambulatory Visit: Payer: Self-pay | Admitting: Oncology

## 2012-09-25 ENCOUNTER — Other Ambulatory Visit: Payer: Self-pay | Admitting: *Deleted

## 2012-10-21 ENCOUNTER — Other Ambulatory Visit: Payer: Medicare HMO | Admitting: Lab

## 2012-10-23 ENCOUNTER — Ambulatory Visit: Payer: Medicare HMO

## 2012-11-23 ENCOUNTER — Other Ambulatory Visit (HOSPITAL_BASED_OUTPATIENT_CLINIC_OR_DEPARTMENT_OTHER): Payer: Medicare Other | Admitting: Lab

## 2012-11-23 ENCOUNTER — Ambulatory Visit (HOSPITAL_BASED_OUTPATIENT_CLINIC_OR_DEPARTMENT_OTHER): Payer: Medicare Other | Admitting: Pharmacist

## 2012-11-23 DIAGNOSIS — I2699 Other pulmonary embolism without acute cor pulmonale: Secondary | ICD-10-CM

## 2012-11-23 LAB — PROTIME-INR

## 2012-11-23 LAB — POCT INR: INR: 2.5

## 2012-11-23 NOTE — Progress Notes (Signed)
PT seen with no changes to report. Therapeutic today at 2.5. Continue 6mg  daily.   Recheck INR in 4 weeks on 12/21/12 at 3:30 for lab, 3:45 for CC and MD at 4:00

## 2012-11-23 NOTE — Patient Instructions (Signed)
Continue 6mg  daily.   Recheck INR in 4 weeks on 12/21/12 at 3:30 for lab, 3:45 for CC and MD at 4:00

## 2012-12-21 ENCOUNTER — Encounter: Payer: Self-pay | Admitting: Oncology

## 2012-12-21 ENCOUNTER — Ambulatory Visit: Payer: Medicare Other | Admitting: Pharmacist

## 2012-12-21 ENCOUNTER — Other Ambulatory Visit: Payer: Self-pay | Admitting: Medical Oncology

## 2012-12-21 ENCOUNTER — Ambulatory Visit (HOSPITAL_BASED_OUTPATIENT_CLINIC_OR_DEPARTMENT_OTHER): Payer: Medicare Other | Admitting: Oncology

## 2012-12-21 ENCOUNTER — Other Ambulatory Visit (HOSPITAL_BASED_OUTPATIENT_CLINIC_OR_DEPARTMENT_OTHER): Payer: Medicare Other | Admitting: Lab

## 2012-12-21 VITALS — BP 124/71 | HR 79 | Temp 98.6°F | Resp 18 | Ht 71.0 in | Wt 177.0 lb

## 2012-12-21 DIAGNOSIS — Z7901 Long term (current) use of anticoagulants: Secondary | ICD-10-CM

## 2012-12-21 DIAGNOSIS — I2699 Other pulmonary embolism without acute cor pulmonale: Secondary | ICD-10-CM

## 2012-12-21 DIAGNOSIS — I82409 Acute embolism and thrombosis of unspecified deep veins of unspecified lower extremity: Secondary | ICD-10-CM

## 2012-12-21 LAB — CBC WITH DIFFERENTIAL/PLATELET
Basophils Absolute: 0.1 10*3/uL (ref 0.0–0.1)
Eosinophils Absolute: 0.2 10*3/uL (ref 0.0–0.5)
HGB: 15.7 g/dL (ref 13.0–17.1)
MCV: 90.8 fL (ref 79.3–98.0)
NEUT#: 2.6 10*3/uL (ref 1.5–6.5)
RDW: 12.8 % (ref 11.0–14.6)
lymph#: 1.5 10*3/uL (ref 0.9–3.3)

## 2012-12-21 LAB — COMPREHENSIVE METABOLIC PANEL (CC13)
ALT: 21 U/L (ref 0–55)
AST: 23 U/L (ref 5–34)
Albumin: 3.6 g/dL (ref 3.5–5.0)
CO2: 28 mEq/L (ref 22–29)
Calcium: 9.4 mg/dL (ref 8.4–10.4)
Chloride: 103 mEq/L (ref 98–107)
Potassium: 4.2 mEq/L (ref 3.5–5.1)
Sodium: 138 mEq/L (ref 136–145)
Total Protein: 7.5 g/dL (ref 6.4–8.3)

## 2012-12-21 LAB — POCT INR: INR: 3.2

## 2012-12-21 MED ORDER — RIVAROXABAN 10 MG PO TABS: 20.0000 mg | ORAL_TABLET | Freq: Every day | ORAL | Status: DC

## 2012-12-21 NOTE — Progress Notes (Signed)
INR = 3.2 on Warfarin 6 mg daily No complaints re: anticoag. His only complaint is that his legs hurt.  This is not new. INR at goal.  No change. Repeat protime 1 month. Ebony Hail, Pharm.D., CPP 12/21/2012@3 :50 PM

## 2012-12-21 NOTE — Progress Notes (Signed)
PROBLEM LIST:  1. History of bilateral pulmonary emboli in July 2006 without obvious  cause. A hypercoagulable workup at that time was negative.  2. Left leg deep vein thrombosis by Doppler study in  August 2007 when the patient either had a subtherapeutic PT/INR or  was off Coumadin.  3. History of right incarcerated inguinal hernia treated with surgery on 11/29/2009.  4. History of cervical spine surgery by Dr. Eliane Decree in 2003.  5. History of cholesterol gallstones seen on CT scan 03/14/2005.  6. Hypertension.  7. Anticoagulation therapy with Coumadin.    MEDICATIONS:  Reviewed and recorded. Current Outpatient Prescriptions  Medication Sig Dispense Refill  . acetaminophen (TYLENOL) 500 MG tablet Take 500 mg by mouth every 6 (six) hours as needed.      Marland Kitchen lisinopril-hydrochlorothiazide (PRINZIDE,ZESTORETIC) 20-12.5 MG per tablet Take 1 tablet by mouth daily.      Marland Kitchen warfarin (COUMADIN) 2 MG tablet Take 3 tablets (6 mg total) by mouth daily.  90 tablet  5  . rivaroxaban (XARELTO) 10 MG TABS tablet Take 2 tablets (20 mg total) by mouth daily.  60 tablet  3   No current facility-administered medications for this visit.     TREATMENT PROGRAM:  Travis Morgan is currently on Coumadin 6 mg daily with pro time/INRs checked monthly and monitored via the Coumadin Clinic.  We have given Travis Morgan a prescription for Xarelto 20 mg to take daily once his INR is less than 3.0.  Travis Morgan will see whether his insurance will cover this adequately.  SMOKING HISTORY:  The patient is a former smoker.  HISTORY:  I saw Travis Morgan today for followup of his history of pulmonary emboli and DVT which date back to 2006 and 2007.  There was no obvious etiology.  Travis Morgan is currently on 6 mg of Coumadin.  We feel that he needs lifelong anticoagulation.  A hypercoagulable workup in the past was negative.  Travis Morgan has been fairly compliant with his followup. There have been some fluctuations of his pro times,  but they have been fairly consistent for the most part.  Travis Morgan denies any medical problems.  He has not had any surgery or emergency room visits.  He denies any bleeding or bruising problems and is without complaints.  He does have Medicare and [employer].  He is on disability.  He denies any respiratory problems or leg problems.  PHYSICAL EXAM:  General:  Travis Morgan has lost some weight.  He says that he is eating well.  He says he has been walking and getting exercise. Weight is 177 pounds, height 5 feet 11 inches, body surface area 2.01 sq m.  Vital Signs:  Blood pressure 124/71.  Other vital signs are normal. HEENT:  There is no scleral icterus.  He has bilateral conjunctival injection as previously noted.  Mouth and pharynx are benign.  He is edentulous.  No peripheral adenopathy palpable.  Heart and lungs are normal.  Abdomen:  Benign with no organomegaly or masses palpable. Extremities:  No peripheral edema or calf tenderness.  No petechiae or purpura.  He has a cyst over his central forehead.  Neurologic exam was nonfocal.  LABORATORY DATA:  Today, white count 4.8, ANC 2.6, hemoglobin 15.7, hematocrit 45.9, platelets 223,000.  Chemistries from today were normal. Prothrombin time today was 38.4 with an INR of 3.20.  IMAGING STUDIES:  1. CT angiogram of the chest with contrast on 03/15/2005 showed  bilateral central and lower lobe segmental pulmonary emboli. There  was minimal consolidation/atelectasis in the posterior basal  segments of both lower lobes as well as a tiny left pleural  effusion.  2. CT angiogram of the chest on 03/14/2006 showed no pulmonary  emboli or acute findings.  3. Chest x-ray, two-view, from 11/28/2009 showed no acute or active cardiopulmonary process.    IMPRESSION AND PLAN:  We will try to convert Travis Morgan to Xarelto if his insurance will cover this adequately.  We have written out a prescription for Xarelto 20 mg to try to make that  conversion.  Travis Morgan will check with his pharmacy to make sure that this is going to be affordable for him.  If it is, I have instructed him to call us and we will probably hold his Coumadin for 1 day before starting Xarelto in view of his INR of 3.2.  If there is a cost issue, then we will just continue with the Coumadin at 6 mg daily and continue with monthly pro times.  We will plan to see Travis Morgan again in 6 months, at which time we will check CBC, chemistries, and I have also ordered a pro time.   ______________________________ Samul Dada, M.D. DSM/MEDQ  D:  12/21/2012  T:  12/21/2012  Job:  [id number]

## 2012-12-21 NOTE — Progress Notes (Signed)
This office note has been dictated.  #[id number]

## 2012-12-22 ENCOUNTER — Telehealth: Payer: Self-pay | Admitting: Oncology

## 2012-12-22 ENCOUNTER — Other Ambulatory Visit: Payer: Medicare Other | Admitting: Lab

## 2012-12-22 ENCOUNTER — Ambulatory Visit: Payer: Medicare Other

## 2012-12-22 NOTE — Telephone Encounter (Signed)
, °

## 2012-12-30 ENCOUNTER — Encounter: Payer: Self-pay | Admitting: Oncology

## 2012-12-30 NOTE — Progress Notes (Signed)
Travis Dada, MD at 12/21/2012 9:12 PM     Status: Signed             PROBLEM LIST:  1. History of bilateral pulmonary emboli in July 2006 without obvious  cause. A hypercoagulable workup at that time was negative.  2. Left leg deep vein thrombosis by Doppler study in  August 2007 when the patient either had a subtherapeutic PT/INR or  was off Coumadin.  3. History of right incarcerated inguinal hernia treated with surgery on 11/29/2009.  4. History of cervical spine surgery by Dr. Eliane Morgan in 2003.  5. History of cholesterol gallstones seen on CT scan 03/14/2005.  6. Hypertension.  7. Anticoagulation therapy with Coumadin.   MEDICATIONS: Reviewed and recorded.      Current Outpatient Prescriptions      Medication  Sig  Dispense  Refill      .  acetaminophen (TYLENOL) 500 MG tablet  Take 500 mg by mouth every 6 (six) hours as needed.        Marland Kitchen  lisinopril-hydrochlorothiazide (PRINZIDE,ZESTORETIC) 20-12.5 MG per tablet  Take 1 tablet by mouth daily.        Marland Kitchen  warfarin (COUMADIN) 2 MG tablet  Take 3 tablets (6 mg total) by mouth daily.  90 tablet  5      .  rivaroxaban (XARELTO) 10 MG TABS tablet  Take 2 tablets (20 mg total) by mouth daily.  60 tablet  3      No current facility-administered medications for this visit.       TREATMENT PROGRAM: Travis Morgan is currently on Coumadin 6 mg daily with pro  time/INRs checked monthly and monitored via the [facility]. We  have given Travis Morgan a prescription for Xarelto 20 mg to take daily once  his INR is less than 3.0. Travis Morgan will see whether his insurance will  cover this adequately.   SMOKING HISTORY: The patient is a former smoker.    HISTORY: I saw Travis Morgan today for followup of his history of  pulmonary emboli and DVT which date back to 2006 and 2007. There was no  obvious etiology. Travis Morgan is currently on 6 mg of Coumadin. We feel  that he needs lifelong anticoagulation. A hypercoagulable workup in the  past was  negative. Travis Morgan has been fairly compliant with his followup.  There have been some fluctuations of his pro times, but they have been  fairly consistent for the most part.   Travis Morgan denies any medical problems. He has not had any surgery or  emergency room visits. He denies any bleeding or bruising problems and  is without complaints. He does have Medicare and Travis Morgan. He is on disability. He denies any respiratory problems or leg  problems.    PHYSICAL EXAM: General: Travis Morgan has lost some weight. He says that he  is eating well. He says he has been walking and getting exercise.  Weight is 177 pounds, height 5 feet 11 inches, body surface area 2.01 sq  m. Vital Signs: Blood pressure 124/71. Other vital signs are normal.  HEENT: There is no scleral icterus. He has bilateral conjunctival  injection as previously noted. Mouth and pharynx are benign. He is  edentulous. No peripheral adenopathy palpable. Heart and lungs are  normal. Abdomen: Benign with no organomegaly or masses palpable.  Extremities: No peripheral edema or calf tenderness. No petechiae or  purpura. He has a cyst over his central forehead. Neurologic exam  was  nonfocal.    LABORATORY DATA: Today, white count 4.8, ANC 2.6, hemoglobin 15.7,  hematocrit 45.9, platelets 223,000. Chemistries from today were normal.  Prothrombin time today was 38.4 with an INR of 3.20.    IMAGING STUDIES:  1. CT angiogram of the chest with contrast on 03/15/2005 showed  bilateral central and lower lobe segmental pulmonary emboli. There  was minimal consolidation/atelectasis in the posterior basal  segments of both lower lobes as well as a tiny left pleural  effusion.  2. CT angiogram of the chest on 03/14/2006 showed no pulmonary  emboli or acute findings.  3. Chest x-ray, two-view, from 11/28/2009 showed no acute or active cardiopulmonary process.    IMPRESSION AND PLAN: We will try to convert Travis Morgan to Xarelto if his   insurance will cover this adequately. We have written out a  prescription for Xarelto 20 mg to try to make that conversion. [place]  will check with his pharmacy to make sure that this is going to be  affordable for him. If it is, I have instructed him to call us and we  will probably hold his Coumadin for 1 day before starting Xarelto in  view of his INR of 3.2. If there is a cost issue,  then we will just continue with the Coumadin at 6 mg daily and continue  with monthly pro times. We will plan to see Travis Morgan again in 6 months,  at which time we will check CBC, chemistries, and I have also ordered a  pro time.  ______________________________  Travis Morgan, M.D.  DSM/MEDQ D: 12/21/2012 T: 12/21/2012 Job: [id number]

## 2013-01-22 ENCOUNTER — Other Ambulatory Visit: Payer: Medicare Other

## 2013-01-22 ENCOUNTER — Ambulatory Visit (HOSPITAL_BASED_OUTPATIENT_CLINIC_OR_DEPARTMENT_OTHER): Payer: Medicare Other | Admitting: Pharmacist

## 2013-01-22 DIAGNOSIS — I2699 Other pulmonary embolism without acute cor pulmonale: Secondary | ICD-10-CM

## 2013-01-22 LAB — PROTIME-INR

## 2013-01-22 NOTE — Patient Instructions (Signed)
Continue 6mg  daily.   Recheck INR in 5 weeks on 02/25/13 at 3:30pm for lab and 3:45pm for coumadin clinic.

## 2013-01-22 NOTE — Progress Notes (Signed)
INR within goal today.  Pt has NKDA. No problems to report. Pt has decided not to take Xarelto. He wishes to stay on Coumadin for now. Medication list has been updated. Continue 6mg  daily.   Recheck INR in 5 weeks on 02/25/13 at 3:30pm for lab and 3:45pm for coumadin clinic.

## 2013-02-25 ENCOUNTER — Ambulatory Visit (HOSPITAL_BASED_OUTPATIENT_CLINIC_OR_DEPARTMENT_OTHER): Payer: Medicare Other | Admitting: Pharmacist

## 2013-02-25 ENCOUNTER — Other Ambulatory Visit: Payer: Medicare Other | Admitting: Lab

## 2013-02-25 DIAGNOSIS — I2699 Other pulmonary embolism without acute cor pulmonale: Secondary | ICD-10-CM

## 2013-02-25 NOTE — Progress Notes (Signed)
INR slightly below goal today. Goal INR = 2.5-3.5. No problems to report. No s/s of clotting. No missed doses. No changes in diet, medications, etc. Pt has been very stable on this dose. Will continue same dose. Continue 6mg  daily.   Recheck INR in 4 weeks on 03/25/13 at 3:00pm for lab and 3:15pm for coumadin clinic.

## 2013-02-25 NOTE — Patient Instructions (Signed)
Continue 6mg  daily.   Recheck INR in 4 weeks on 03/25/13 at 3:00pm for lab and 3:15pm for coumadin clinic.

## 2013-03-25 ENCOUNTER — Ambulatory Visit: Payer: Medicare Other | Admitting: Pharmacist

## 2013-03-25 ENCOUNTER — Other Ambulatory Visit (HOSPITAL_BASED_OUTPATIENT_CLINIC_OR_DEPARTMENT_OTHER): Payer: Medicare Other | Admitting: Lab

## 2013-03-25 DIAGNOSIS — I2699 Other pulmonary embolism without acute cor pulmonale: Secondary | ICD-10-CM

## 2013-03-25 LAB — PROTIME-INR
INR: 2.8 (ref 2.00–3.50)
Protime: 33.6 Seconds — ABNORMAL HIGH (ref 10.6–13.4)

## 2013-03-25 LAB — POCT INR: INR: 2.8

## 2013-03-25 NOTE — Patient Instructions (Signed)
Continue 6mg  daily.   Recheck INR in 6 weeks on 05/06/13 at 3:00pm for lab and 3:15pm for coumadin clinic

## 2013-03-25 NOTE — Progress Notes (Signed)
Saw patient in clinic today. INR=2.8 on 6mg  daily No changes to report.  No new meds.  Does not need refills. Continue 6mg  daily.   Recheck INR in 6 weeks on 05/06/13 at 3:00pm for lab and 3:15pm for coumadin clinic

## 2013-04-05 ENCOUNTER — Ambulatory Visit: Payer: Medicare Other

## 2013-04-05 ENCOUNTER — Other Ambulatory Visit: Payer: Medicare Other | Admitting: Lab

## 2013-05-03 ENCOUNTER — Other Ambulatory Visit: Payer: Self-pay | Admitting: Oncology

## 2013-05-05 ENCOUNTER — Other Ambulatory Visit: Payer: Self-pay

## 2013-05-05 DIAGNOSIS — I82402 Acute embolism and thrombosis of unspecified deep veins of left lower extremity: Secondary | ICD-10-CM

## 2013-05-05 DIAGNOSIS — I2699 Other pulmonary embolism without acute cor pulmonale: Secondary | ICD-10-CM

## 2013-05-06 ENCOUNTER — Other Ambulatory Visit (HOSPITAL_BASED_OUTPATIENT_CLINIC_OR_DEPARTMENT_OTHER): Payer: Medicare Other | Admitting: Lab

## 2013-05-06 ENCOUNTER — Ambulatory Visit (HOSPITAL_BASED_OUTPATIENT_CLINIC_OR_DEPARTMENT_OTHER): Payer: Medicare Other | Admitting: Pharmacist

## 2013-05-06 DIAGNOSIS — I2699 Other pulmonary embolism without acute cor pulmonale: Secondary | ICD-10-CM

## 2013-05-06 DIAGNOSIS — I82409 Acute embolism and thrombosis of unspecified deep veins of unspecified lower extremity: Secondary | ICD-10-CM

## 2013-05-06 DIAGNOSIS — I82402 Acute embolism and thrombosis of unspecified deep veins of left lower extremity: Secondary | ICD-10-CM

## 2013-05-06 LAB — PROTIME-INR: Protime: 30 Seconds — ABNORMAL HIGH (ref 10.6–13.4)

## 2013-05-06 NOTE — Progress Notes (Signed)
INR = 2.5 on Coumadin 6 mg daily. No bleeding or bruising. Meds stay the same.  He called in refill to [facility] for Coumadin & they gave him 9 tablets.  They reordered more for their stock & he has to go pick the remainder up. INR at goal.  Continue same dose. Return in 6 weeks. Ebony Hail, Pharm.D., CPP 05/06/2013@3 :05 PM

## 2013-05-20 ENCOUNTER — Telehealth: Payer: Self-pay | Admitting: Internal Medicine

## 2013-05-20 NOTE — Telephone Encounter (Signed)
Moved 11/6 lb/fu to 11/5 @ 1pm due to cp1 PAL. lmonvm for pt re change w/new d/t. Schedule mailed.

## 2013-06-02 ENCOUNTER — Other Ambulatory Visit: Payer: Self-pay | Admitting: *Deleted

## 2013-06-02 MED ORDER — WARFARIN SODIUM 2 MG PO TABS: ORAL_TABLET | ORAL | Status: DC

## 2013-06-02 NOTE — Telephone Encounter (Signed)
Pt being followed by Coumadin Clinic at [facility], [place]. To see Dr Rosie Fate 06/23/13. Previously Dr Murinson's patient

## 2013-06-17 ENCOUNTER — Other Ambulatory Visit (HOSPITAL_BASED_OUTPATIENT_CLINIC_OR_DEPARTMENT_OTHER): Payer: Medicare Other | Admitting: Lab

## 2013-06-17 ENCOUNTER — Encounter (INDEPENDENT_AMBULATORY_CARE_PROVIDER_SITE_OTHER): Payer: Self-pay

## 2013-06-17 ENCOUNTER — Ambulatory Visit (HOSPITAL_BASED_OUTPATIENT_CLINIC_OR_DEPARTMENT_OTHER): Payer: Self-pay | Admitting: Pharmacist

## 2013-06-17 DIAGNOSIS — I2699 Other pulmonary embolism without acute cor pulmonale: Secondary | ICD-10-CM

## 2013-06-17 DIAGNOSIS — I82402 Acute embolism and thrombosis of unspecified deep veins of left lower extremity: Secondary | ICD-10-CM

## 2013-06-17 LAB — POCT INR: INR: 2.5

## 2013-06-17 NOTE — Patient Instructions (Signed)
INR at goal No changes Continue 6mg  daily.   Recheck INR in 6 weeks on 08/05/13 at 3:00pm for lab and 3:15pm for coumadin clinic.

## 2013-06-17 NOTE — Progress Notes (Signed)
INR = 2.5 on Coumadin 6 mg daily.  No bleeding or bruising.  No changes to his medication or Diet INR at goal. Continue same dose.  Pt states he has been taking coumadin as instructed with no missed or extra doses Return in 6 weeks. On 08/05/13 at 3pm for lab and 3:15pm for coumadin clinic

## 2013-06-23 ENCOUNTER — Ambulatory Visit (HOSPITAL_BASED_OUTPATIENT_CLINIC_OR_DEPARTMENT_OTHER): Payer: Medicare Other | Admitting: Internal Medicine

## 2013-06-23 ENCOUNTER — Other Ambulatory Visit (HOSPITAL_BASED_OUTPATIENT_CLINIC_OR_DEPARTMENT_OTHER): Payer: Medicare Other | Admitting: Lab

## 2013-06-23 ENCOUNTER — Other Ambulatory Visit: Payer: Self-pay | Admitting: Internal Medicine

## 2013-06-23 VITALS — BP 117/75 | HR 69 | Temp 96.7°F | Resp 20 | Ht 71.0 in | Wt 175.8 lb

## 2013-06-23 DIAGNOSIS — I2699 Other pulmonary embolism without acute cor pulmonale: Secondary | ICD-10-CM

## 2013-06-23 DIAGNOSIS — I82402 Acute embolism and thrombosis of unspecified deep veins of left lower extremity: Secondary | ICD-10-CM

## 2013-06-23 DIAGNOSIS — I10 Essential (primary) hypertension: Secondary | ICD-10-CM

## 2013-06-23 DIAGNOSIS — I82409 Acute embolism and thrombosis of unspecified deep veins of unspecified lower extremity: Secondary | ICD-10-CM

## 2013-06-23 LAB — CBC WITH DIFFERENTIAL/PLATELET
BASO%: 1.1 % (ref 0.0–2.0)
LYMPH%: 32.4 % (ref 14.0–49.0)
MCHC: 33.7 g/dL (ref 32.0–36.0)
MCV: 93.2 fL (ref 79.3–98.0)
MONO#: 0.3 10*3/uL (ref 0.1–0.9)
MONO%: 8.3 % (ref 0.0–14.0)
Platelets: 223 10*3/uL (ref 140–400)
RBC: 4.79 10*6/uL (ref 4.20–5.82)
WBC: 3.9 10*3/uL — ABNORMAL LOW (ref 4.0–10.3)
lymph#: 1.2 10*3/uL (ref 0.9–3.3)

## 2013-06-23 LAB — COMPREHENSIVE METABOLIC PANEL (CC13)
ALT: 16 U/L (ref 0–55)
AST: 21 U/L (ref 5–34)
Alkaline Phosphatase: 70 U/L (ref 40–150)
CO2: 28 mEq/L (ref 22–29)
Calcium: 9.8 mg/dL (ref 8.4–10.4)
Sodium: 137 mEq/L (ref 136–145)
Total Bilirubin: 0.63 mg/dL (ref 0.20–1.20)
Total Protein: 7.5 g/dL (ref 6.4–8.3)

## 2013-06-23 NOTE — Progress Notes (Signed)
[facility] OFFICE PROGRESS NOTE  DIAGNOSIS: Left leg DVT  Pulmonary embolism  Hypertension  Chief Complaint  Patient presents with  . Pulmonary embolism    CURRENT THERAPY: Coumadin 6 mg daily (anticoagulation managed by coumadin clinic).  INTERVAL HISTORY: Travis Morgan 62 y.o. male with a history of pulmonary emboli and DVT without obvious etiology is here for follow-up. Pulmonary emboli were diagnosed in late July 2006. He is compliant to his coumadin. He was last seen by Dr. Arline Asp on 06/02/2012. He has been very compliant with his monitoring. He is without bleeding or bruising. He denies any respiratory problems or leg swelling or pain in the legs.   MEDICAL HISTORY:No past medical history on file.  INTERIM HISTORY: has Pulmonary embolism; Left leg DVT; Hypertension; and Other pulmonary embolism and infarction on his problem list.    ALLERGIES:  has No Known Allergies.  MEDICATIONS: has a current medication list which includes the following prescription(s): acetaminophen, lisinopril-hydrochlorothiazide, and warfarin.  SURGICAL HISTORY: No past surgical history on file.  PROBLEM LIST:  1. History of bilateral pulmonary emboli in July 2006 without obvious cause. A hypercoagulable workup at that time was negative.  2. Left leg deep vein thrombosis by Doppler study in  August 2007 when the patient either had a subtherapeutic PT/INR or was off Coumadin.  3. History of right incarcerated inguinal hernia treated with surgery on 11/29/2009.  4. History of cervical spine surgery by Dr. Eliane Decree in 2003.  5. History of cholesterol gallstones seen on CT scan 03/14/2005.  6. Hypertension.  7. Anticoagulation therapy with Coumadin.   REVIEW OF SYSTEMS:   Constitutional: Denies fevers, chills or abnormal weight loss Eyes: Denies blurriness of vision Ears, nose, mouth, throat, and face: Denies mucositis or sore throat Respiratory: Denies cough, dyspnea or  wheezes Cardiovascular: Denies palpitation, chest discomfort or lower extremity swelling Gastrointestinal:  Denies nausea, heartburn or change in bowel habits Skin: Denies abnormal skin rashes Lymphatics: Denies new lymphadenopathy or easy bruising Neurological:Denies numbness, tingling or new weaknesses Behavioral/Psych: Mood is stable, no new changes  All other systems were reviewed with the patient and are negative.  PHYSICAL EXAMINATION: ECOG PERFORMANCE STATUS: 0 - Asymptomatic  Blood pressure 117/75, pulse 69, temperature 96.7 F (35.9 C), temperature source Oral, resp. rate 20, height 5\' 11"  (1.803 m), weight 175 lb 12.8 oz (79.742 kg), SpO2 99.00%.  GENERAL:alert, no distress and comfortable; oily skin SKIN: skin color, texture, turgor are normal, no rashes or significant lesions; Quarter-sized cyst in the middle of his forehead which has been there for many years.   EYES: normal, Conjunctiva are pink and non-injected, sclera clear OROPHARYNX:no exudate, no erythema and lips, buccal mucosa, and tongue normal  NECK: supple, thyroid normal size, non-tender, without nodularity LYMPH:  no palpable lymphadenopathy in the cervical, axillary or supraclavicular LUNGS: clear to auscultation and percussion with normal breathing effort HEART: regular rate & rhythm and no murmurs and no lower extremity edema ABDOMEN:abdomen soft, non-tender and normal bowel sounds Musculoskeletal:no cyanosis of digits and no clubbing  NEURO: alert & oriented x 3 with fluent speech, no focal motor/sensory deficits; Shuffling gait observed.  LABORATORY DATA: Results for orders placed in visit on 06/23/13 (from the past 48 hour(s))  CBC WITH DIFFERENTIAL     Status: Abnormal   Collection Time    06/23/13  1:10 PM      Result Value Range   WBC 3.9 (*) 4.0 - 10.3 10e3/uL   NEUT# 2.1  1.5 - 6.5 10e3/uL   HGB 15.0  13.0 - 17.1 g/dL   HCT 96.0  45.4 - 09.8 %   Platelets 223  140 - 400 10e3/uL   MCV 93.2   79.3 - 98.0 fL   MCH 31.4  27.2 - 33.4 pg   MCHC 33.7  32.0 - 36.0 g/dL   RBC 1.19  1.47 - 8.29 10e6/uL   RDW 12.0  11.0 - 14.6 %   lymph# 1.2  0.9 - 3.3 10e3/uL   MONO# 0.3  0.1 - 0.9 10e3/uL   Eosinophils Absolute 0.1  0.0 - 0.5 10e3/uL   Basophils Absolute 0.0  0.0 - 0.1 10e3/uL   NEUT% 54.6  39.0 - 75.0 %   LYMPH% 32.4  14.0 - 49.0 %   MONO% 8.3  0.0 - 14.0 %   EOS% 3.6  0.0 - 7.0 %   BASO% 1.1  0.0 - 2.0 %  PROTIME-INR     Status: Abnormal   Collection Time    06/23/13  1:10 PM      Result Value Range   Protime 30.0 (*) 10.6 - 13.4 Seconds   INR 2.50  2.00 - 3.50   Comment: INR is useful only to assess adequacy of anticoagulation with coumadin when comparing results from different labs. It should not be used to estimate bleeding risk or presence/abscense of coagulopathy in patients not on coumadin. Expected INR ranges for      nontherapeutic patients is 0.88 - 1.12.   Lovenox No         Labs:  Lab Results  Component Value Date   WBC 3.9* 06/23/2013   HGB 15.0 06/23/2013   HCT 44.7 06/23/2013   MCV 93.2 06/23/2013   PLT 223 06/23/2013   NEUTROABS 2.1 06/23/2013      Chemistry      Component Value Date/Time   NA 138 12/21/2012 1529   NA 138 10/21/2011 1354   K 4.2 12/21/2012 1529   K 4.3 10/21/2011 1354   CL 103 12/21/2012 1529   CL 107 10/21/2011 1354   CO2 28 12/21/2012 1529   CO2 23 10/21/2011 1354   BUN 16.3 12/21/2012 1529   BUN 14 10/21/2011 1354   CREATININE 1.2 12/21/2012 1529   CREATININE 1.21 10/21/2011 1354      Component Value Date/Time   CALCIUM 9.4 12/21/2012 1529   CALCIUM 9.2 10/21/2011 1354   ALKPHOS 85 12/21/2012 1529   ALKPHOS 73 10/21/2011 1354   AST 23 12/21/2012 1529   AST 18 10/21/2011 1354   ALT 21 12/21/2012 1529   ALT 16 10/21/2011 1354   BILITOT 0.64 12/21/2012 1529   BILITOT 0.5 10/21/2011 1354     Coagulation profile  Recent Labs Lab 06/17/13 1500 06/17/13 1502 06/23/13 1310  INR 2.5 2.50 2.50  PROTIME  --  30.0* 30.0*    CBC:  Recent Labs Lab  06/23/13 1310  WBC 3.9*  NEUTROABS 2.1  HGB 15.0  HCT 44.7  MCV 93.2  PLT 223    Studies:  No results found.   RADIOGRAPHIC STUDIES: 1. CT angiogram of the chest with contrast on 03/15/2005 showed bilateral central and lower lobe segmental pulmonary emboli. There was minimal consolidation/atelectasis in the posterior basal  segments of both lower lobes as well as a tiny left pleural  effusion.  2. CT angiogram of the chest on 03/14/2006 showed no  pulmonary emboli or acute findings.  3. Chest x-ray, two-view, from 11/28/2009 showed no acute or  active cardiopulmonary process.  ASSESSMENT: Travis Morgan 62 y.o. male with a history of Left leg DVT  Pulmonary embolism  Hypertension   PLAN:   1.PE/DVT. --He continues to do well. We will continue to check his INR with the anticoagulation clinic with a goal INR between 2 and 3.  His coumadin dose,  which has been fairly stable at 6 mg daily now for the last couple of years.   2. Follow-up. We will plan to see Travis Morgan again in 6 months at which time we will check CBC, chemistries, and pro time.  All questions were answered. The patient knows to call the clinic with any problems, questions or concerns. We can certainly see the patient much sooner if necessary.  I spent 10 minutes counseling the patient face to face. The total time spent in the appointment was 15 minutes.    Ember Henrikson, MD 06/23/2013 1:54 PM

## 2013-06-24 ENCOUNTER — Telehealth: Payer: Self-pay | Admitting: Internal Medicine

## 2013-06-24 ENCOUNTER — Ambulatory Visit: Payer: Medicare Other

## 2013-06-24 ENCOUNTER — Other Ambulatory Visit: Payer: Medicare Other | Admitting: Lab

## 2013-06-24 NOTE — Telephone Encounter (Signed)
sw wife made aware of 12/22/13 appts per 06/23/13 POF shh

## 2013-07-07 ENCOUNTER — Other Ambulatory Visit: Payer: Self-pay | Admitting: *Deleted

## 2013-07-07 MED ORDER — WARFARIN SODIUM 2 MG PO TABS: ORAL_TABLET | ORAL | Status: DC

## 2013-08-04 ENCOUNTER — Other Ambulatory Visit: Payer: Self-pay | Admitting: Internal Medicine

## 2013-08-05 ENCOUNTER — Ambulatory Visit (HOSPITAL_BASED_OUTPATIENT_CLINIC_OR_DEPARTMENT_OTHER): Payer: Self-pay | Admitting: Pharmacist

## 2013-08-05 ENCOUNTER — Other Ambulatory Visit (HOSPITAL_BASED_OUTPATIENT_CLINIC_OR_DEPARTMENT_OTHER): Payer: Medicare Other

## 2013-08-05 ENCOUNTER — Encounter (INDEPENDENT_AMBULATORY_CARE_PROVIDER_SITE_OTHER): Payer: Self-pay

## 2013-08-05 DIAGNOSIS — I2699 Other pulmonary embolism without acute cor pulmonale: Secondary | ICD-10-CM

## 2013-08-05 DIAGNOSIS — I82402 Acute embolism and thrombosis of unspecified deep veins of left lower extremity: Secondary | ICD-10-CM

## 2013-08-05 DIAGNOSIS — I82409 Acute embolism and thrombosis of unspecified deep veins of unspecified lower extremity: Secondary | ICD-10-CM

## 2013-08-05 LAB — PROTIME-INR: INR: 2.7 (ref 2.00–3.50)

## 2013-08-05 NOTE — Progress Notes (Signed)
INR within goal today. Pt doing well. No changes to report. No problems regarding anticoagulation. No missed doses. Continue 6mg  daily.   Recheck INR in 6 weeks on 09/16/13 at 3:00pm for lab and 3:15pm for coumadin clinic.

## 2013-08-05 NOTE — Patient Instructions (Signed)
Continue 6mg  daily.   Recheck INR in 6 weeks on 09/16/13 at 3:00pm for lab and 3:15pm for coumadin clinic.

## 2013-09-02 ENCOUNTER — Other Ambulatory Visit: Payer: Self-pay | Admitting: Internal Medicine

## 2013-09-07 ENCOUNTER — Other Ambulatory Visit: Payer: Self-pay | Admitting: Internal Medicine

## 2013-09-16 ENCOUNTER — Ambulatory Visit (HOSPITAL_BASED_OUTPATIENT_CLINIC_OR_DEPARTMENT_OTHER): Payer: Medicare HMO | Admitting: Pharmacist

## 2013-09-16 ENCOUNTER — Other Ambulatory Visit (HOSPITAL_BASED_OUTPATIENT_CLINIC_OR_DEPARTMENT_OTHER): Payer: Medicare HMO

## 2013-09-16 ENCOUNTER — Encounter (INDEPENDENT_AMBULATORY_CARE_PROVIDER_SITE_OTHER): Payer: Self-pay

## 2013-09-16 DIAGNOSIS — I2699 Other pulmonary embolism without acute cor pulmonale: Secondary | ICD-10-CM

## 2013-09-16 DIAGNOSIS — I82402 Acute embolism and thrombosis of unspecified deep veins of left lower extremity: Secondary | ICD-10-CM

## 2013-09-16 LAB — PROTIME-INR
INR: 2 (ref 2.00–3.50)
Protime: 24 Seconds — ABNORMAL HIGH (ref 10.6–13.4)

## 2013-09-16 LAB — POCT INR: INR: 2

## 2013-09-16 NOTE — Progress Notes (Signed)
Pt seen in clinic INR=2.0 No changes to report No new meds Although he is slightly subtherapuetic based on his goal 2.5-3.5, he has been on this dose of 6mg  daily since he started coumadin He is resistant to change as I am hesitant to change.   He favors routine Continue 6mg  daily.   Recheck INR in 6 weeks on 09/30/13 at 3:00pm for lab and 3:15pm for coumadin clinic.

## 2013-09-16 NOTE — Patient Instructions (Signed)
Continue 6mg  daily.   Recheck INR in 6 weeks on 09/30/13 at 3:00pm for lab and 3:15pm for coumadin clinic.

## 2013-10-28 ENCOUNTER — Ambulatory Visit (HOSPITAL_BASED_OUTPATIENT_CLINIC_OR_DEPARTMENT_OTHER): Payer: Medicare HMO | Admitting: Pharmacist

## 2013-10-28 ENCOUNTER — Other Ambulatory Visit (HOSPITAL_BASED_OUTPATIENT_CLINIC_OR_DEPARTMENT_OTHER): Payer: Medicare HMO

## 2013-10-28 DIAGNOSIS — I82409 Acute embolism and thrombosis of unspecified deep veins of unspecified lower extremity: Secondary | ICD-10-CM

## 2013-10-28 DIAGNOSIS — I82402 Acute embolism and thrombosis of unspecified deep veins of left lower extremity: Secondary | ICD-10-CM

## 2013-10-28 DIAGNOSIS — I2699 Other pulmonary embolism without acute cor pulmonale: Secondary | ICD-10-CM

## 2013-10-28 LAB — PROTIME-INR
INR: 2.4 (ref 2.00–3.50)
Protime: 28.8 Seconds — ABNORMAL HIGH (ref 10.6–13.4)

## 2013-10-28 LAB — POCT INR: INR: 2.4

## 2013-10-28 NOTE — Patient Instructions (Signed)
INR at goal  Continue 6mg  daily.   Recheck INR in 6 weeks on 12/09/13 at 3:00pm for lab and 3:15pm for coumadin clinic.

## 2013-10-28 NOTE — Progress Notes (Signed)
INR right at goal today Pt is doing well with no complaints No unusual bleeding or bruising No missed or extra doses No diet or medication changes Plan:  Continue 6mg  daily.   Recheck INR in 6 weeks on 12/09/13 at 3:00pm for lab and 3:15pm for coumadin clinic.

## 2013-11-05 ENCOUNTER — Telehealth: Payer: Self-pay | Admitting: Pharmacist

## 2013-11-05 ENCOUNTER — Other Ambulatory Visit: Payer: Self-pay | Admitting: Internal Medicine

## 2013-11-05 NOTE — Telephone Encounter (Signed)
Pt was scheduled for 11/08/13 for lab/CC rather than 12/09/13. I called pt to clarify that he needs to come 12/09/13 for lab/CC & NOT 3/23.  He expressed understanding & plans to come in April. Kennith Center, Pharm.D., CPP 11/05/2013@3 :44 PM

## 2013-11-08 ENCOUNTER — Other Ambulatory Visit: Payer: Medicare Other

## 2013-11-08 ENCOUNTER — Ambulatory Visit: Payer: Medicare Other

## 2013-12-06 ENCOUNTER — Other Ambulatory Visit: Payer: Self-pay | Admitting: Internal Medicine

## 2013-12-09 ENCOUNTER — Ambulatory Visit (HOSPITAL_BASED_OUTPATIENT_CLINIC_OR_DEPARTMENT_OTHER): Payer: Medicare HMO | Admitting: Pharmacist

## 2013-12-09 ENCOUNTER — Other Ambulatory Visit (HOSPITAL_BASED_OUTPATIENT_CLINIC_OR_DEPARTMENT_OTHER): Payer: Medicare HMO

## 2013-12-09 DIAGNOSIS — I82402 Acute embolism and thrombosis of unspecified deep veins of left lower extremity: Secondary | ICD-10-CM

## 2013-12-09 DIAGNOSIS — I2699 Other pulmonary embolism without acute cor pulmonale: Secondary | ICD-10-CM

## 2013-12-09 LAB — PROTIME-INR
INR: 1.9 — AB (ref 2.00–3.50)
Protime: 22.8 Seconds — ABNORMAL HIGH (ref 10.6–13.4)

## 2013-12-09 LAB — POCT INR: INR: 1.9

## 2013-12-09 NOTE — Progress Notes (Signed)
INR below goal  No changes to report  Pt is doing well with no complaints No unusual bleeding or bruising No missed or extra doses No new meds or diet changes Although he is slightly subtherapuetic based on his goal 2.5-3.5, he has been on this dose of 6mg  daily since he started coumadin  He is resistant to change as I am hesitant to change.  He favors routine Will make no changes Continue 6mg  daily.   Recheck INR in 2weeks on 12/22/13 at 12:30pm for lab, 12:45pm for coumadin clinic and 1:00pm appointment with Dr. Juliann Mule. If INR remains below goal at next visit may consider increasing dose (but patient has been quite stable)

## 2013-12-09 NOTE — Patient Instructions (Addendum)
INR below goal  Will make no changes Continue 6mg  daily.   Recheck INR in 2weeks on 12/22/13 at 12:30pm for lab, 12:45pm for coumadin clinic and 1:00pm appointment with Dr. Juliann Mule.

## 2013-12-22 ENCOUNTER — Other Ambulatory Visit (HOSPITAL_BASED_OUTPATIENT_CLINIC_OR_DEPARTMENT_OTHER): Payer: Medicare HMO

## 2013-12-22 ENCOUNTER — Ambulatory Visit (HOSPITAL_BASED_OUTPATIENT_CLINIC_OR_DEPARTMENT_OTHER): Payer: Medicare HMO | Admitting: Internal Medicine

## 2013-12-22 ENCOUNTER — Ambulatory Visit: Payer: Medicare HMO | Admitting: Pharmacist

## 2013-12-22 VITALS — BP 146/75 | HR 61 | Temp 98.2°F | Resp 18 | Ht 71.0 in | Wt 175.9 lb

## 2013-12-22 DIAGNOSIS — I2699 Other pulmonary embolism without acute cor pulmonale: Secondary | ICD-10-CM

## 2013-12-22 DIAGNOSIS — I82409 Acute embolism and thrombosis of unspecified deep veins of unspecified lower extremity: Secondary | ICD-10-CM

## 2013-12-22 DIAGNOSIS — I82402 Acute embolism and thrombosis of unspecified deep veins of left lower extremity: Secondary | ICD-10-CM

## 2013-12-22 LAB — CBC WITH DIFFERENTIAL/PLATELET
BASO%: 1.3 % (ref 0.0–2.0)
Basophils Absolute: 0.1 10*3/uL (ref 0.0–0.1)
EOS ABS: 0.1 10*3/uL (ref 0.0–0.5)
EOS%: 3.6 % (ref 0.0–7.0)
HCT: 43.6 % (ref 38.4–49.9)
HGB: 14.2 g/dL (ref 13.0–17.1)
LYMPH%: 27.1 % (ref 14.0–49.0)
MCH: 30.5 pg (ref 27.2–33.4)
MCHC: 32.6 g/dL (ref 32.0–36.0)
MCV: 93.5 fL (ref 79.3–98.0)
MONO#: 0.3 10*3/uL (ref 0.1–0.9)
MONO%: 8.8 % (ref 0.0–14.0)
NEUT%: 59.2 % (ref 39.0–75.0)
NEUTROS ABS: 2.4 10*3/uL (ref 1.5–6.5)
Platelets: 196 10*3/uL (ref 140–400)
RBC: 4.66 10*6/uL (ref 4.20–5.82)
RDW: 12.4 % (ref 11.0–14.6)
WBC: 4 10*3/uL (ref 4.0–10.3)
lymph#: 1.1 10*3/uL (ref 0.9–3.3)

## 2013-12-22 LAB — PROTIME-INR
INR: 2.4 (ref 2.00–3.50)
PROTIME: 28.8 s — AB (ref 10.6–13.4)

## 2013-12-22 LAB — COMPREHENSIVE METABOLIC PANEL (CC13)
ALBUMIN: 3.5 g/dL (ref 3.5–5.0)
ALK PHOS: 69 U/L (ref 40–150)
ALT: 13 U/L (ref 0–55)
AST: 18 U/L (ref 5–34)
Anion Gap: 6 mEq/L (ref 3–11)
BUN: 12.9 mg/dL (ref 7.0–26.0)
CALCIUM: 9.5 mg/dL (ref 8.4–10.4)
CO2: 28 mEq/L (ref 22–29)
Chloride: 105 mEq/L (ref 98–109)
Creatinine: 1.1 mg/dL (ref 0.7–1.3)
GLUCOSE: 86 mg/dL (ref 70–140)
POTASSIUM: 4.9 meq/L (ref 3.5–5.1)
Sodium: 140 mEq/L (ref 136–145)
TOTAL PROTEIN: 7 g/dL (ref 6.4–8.3)
Total Bilirubin: 0.5 mg/dL (ref 0.20–1.20)

## 2013-12-22 LAB — POCT INR: INR: 2.4

## 2013-12-22 NOTE — Patient Instructions (Signed)
Continue 6mg  daily.    Recheck INR in 4 weeks on 01/20/14 at 3:30pm for lab and 3:30pm for coumadin clinic.

## 2013-12-22 NOTE — Progress Notes (Signed)
[facility] OFFICE PROGRESS NOTE  DIAGNOSIS: Pulmonary embolism - Plan: CBC with Differential, Comprehensive metabolic panel (Cmet) - [facility], Protime-INR  Left leg DVT - Plan: CBC with Differential, Comprehensive metabolic panel (Cmet) - [facility], Protime-INR  Chief Complaint  Patient presents with  . Pulmonary embolism    CURRENT THERAPY: Coumadin 6 mg daily (anticoagulation managed by coumadin clinic).  INTERVAL HISTORY: Travis Morgan 63 y.o. male with a history of pulmonary emboli and DVT without obvious etiology is here for follow-up. Pulmonary emboli were diagnosed in late July 2006. He is compliant to his coumadin. He was last seen by me on 06/23/2013. He has been very compliant with his monitoring. He is without bleeding or bruising. He denies any respiratory problems or leg swelling or pain in the legs. He denies any recent hospitalizations or emergency room visits. He will have a repeat colonoscopy next Monday.   MEDICAL HISTORY:No past medical history on file.  INTERIM HISTORY: has Pulmonary embolism; Left leg DVT; Hypertension; and Other pulmonary embolism and infarction on his problem list.    ALLERGIES:  has No Known Allergies.  MEDICATIONS: has a current medication list which includes the following prescription(s): acetaminophen, lisinopril-hydrochlorothiazide, warfarin, and warfarin.  SURGICAL HISTORY: No past surgical history on file.  PROBLEM LIST:  1. History of bilateral pulmonary emboli in July 2006 without obvious cause. A hypercoagulable workup at that time was negative.  2. Left leg deep vein thrombosis by Doppler study in  August 2007 when the patient either had a subtherapeutic PT/INR or was off Coumadin.  3. History of right incarcerated inguinal hernia treated with surgery on 11/29/2009.  4. History of cervical spine surgery by Dr. Veatrice Kells in 2003.  5. History of cholesterol gallstones seen on CT scan 03/14/2005.  6. Hypertension.  7.  Anticoagulation therapy with Coumadin.   REVIEW OF SYSTEMS:   Constitutional: Denies fevers, chills or abnormal weight loss Eyes: Denies blurriness of vision Ears, nose, mouth, throat, and face: Denies mucositis or sore throat Respiratory: Denies cough, dyspnea or wheezes Cardiovascular: Denies palpitation, chest discomfort or lower extremity swelling Gastrointestinal:  Denies nausea, heartburn or change in bowel habits Skin: Denies abnormal skin rashes Lymphatics: Denies new lymphadenopathy or easy bruising Neurological:Denies numbness, tingling or new weaknesses Behavioral/Psych: Mood is stable, no new changes  All other systems were reviewed with the patient and are negative.  PHYSICAL EXAMINATION: ECOG PERFORMANCE STATUS: 0 - Asymptomatic  Blood pressure 146/75, pulse 61, temperature 98.2 F (36.8 C), temperature source Oral, resp. rate 18, height 5\' 11"  (1.803 m), weight 175 lb 14.4 oz (79.788 kg), SpO2 100.00%.  GENERAL:alert, no distress and comfortable; oily skin SKIN: skin color, texture, turgor are normal, no rashes or significant lesions; Quarter-sized cyst in the middle of his forehead which has been there for many years.   EYES: normal, Conjunctiva are pink and non-injected, sclera clear OROPHARYNX:no exudate, no erythema and lips, buccal mucosa, and tongue normal  NECK: supple, thyroid normal size, non-tender, without nodularity LYMPH:  no palpable lymphadenopathy in the cervical, axillary or supraclavicular LUNGS: clear to auscultation and percussion with normal breathing effort HEART: regular rate & rhythm and no murmurs and no lower extremity edema ABDOMEN:abdomen soft, non-tender and normal bowel sounds Musculoskeletal:no cyanosis of digits and no clubbing  NEURO: alert & oriented x 3 with fluent speech, no focal motor/sensory deficits; Shuffling gait observed.  LABORATORY DATA: Results for orders placed in visit on 12/22/13 (from the past 48 hour(s))  CBC WITH  DIFFERENTIAL     Status: None   Collection Time    12/22/13 12:05 PM      Result Value Ref Range   WBC 4.0  4.0 - 10.3 10e3/uL   NEUT# 2.4  1.5 - 6.5 10e3/uL   HGB 14.2  13.0 - 17.1 g/dL   HCT 43.6  38.4 - 49.9 %   Platelets 196  140 - 400 10e3/uL   MCV 93.5  79.3 - 98.0 fL   MCH 30.5  27.2 - 33.4 pg   MCHC 32.6  32.0 - 36.0 g/dL   RBC 4.66  4.20 - 5.82 10e6/uL   RDW 12.4  11.0 - 14.6 %   lymph# 1.1  0.9 - 3.3 10e3/uL   MONO# 0.3  0.1 - 0.9 10e3/uL   Eosinophils Absolute 0.1  0.0 - 0.5 10e3/uL   Basophils Absolute 0.1  0.0 - 0.1 10e3/uL   NEUT% 59.2  39.0 - 75.0 %   LYMPH% 27.1  14.0 - 49.0 %   MONO% 8.8  0.0 - 14.0 %   EOS% 3.6  0.0 - 7.0 %   BASO% 1.3  0.0 - 2.0 %  PROTIME-INR     Status: Abnormal   Collection Time    12/22/13 12:05 PM      Result Value Ref Range   Protime 28.8 (*) 10.6 - 13.4 Seconds   INR 2.40  2.00 - 3.50   Comment: INR is useful only to assess adequacy of anticoagulation with coumadin when comparing results from different labs. It should not be used to estimate bleeding risk or presence/abscense of coagulopathy in patients not on coumadin. Expected INR ranges for      nontherapeutic patients is 0.88 - 1.12.   Lovenox No    COMPREHENSIVE METABOLIC PANEL (SN05)     Status: None   Collection Time    12/22/13 12:05 PM      Result Value Ref Range   Sodium 140  136 - 145 mEq/L   Potassium 4.9  3.5 - 5.1 mEq/L   Chloride 105  98 - 109 mEq/L   CO2 28  22 - 29 mEq/L   Glucose 86  70 - 140 mg/dl   BUN 12.9  7.0 - 26.0 mg/dL   Creatinine 1.1  0.7 - 1.3 mg/dL   Total Bilirubin 0.50  0.20 - 1.20 mg/dL   Alkaline Phosphatase 69  40 - 150 U/L   AST 18  5 - 34 U/L   ALT 13  0 - 55 U/L   Total Protein 7.0  6.4 - 8.3 g/dL   Albumin 3.5  3.5 - 5.0 g/dL   Calcium 9.5  8.4 - 10.4 mg/dL   Anion Gap 6  3 - 11 mEq/L       Labs:  Lab Results  Component Value Date   WBC 4.0 12/22/2013   HGB 14.2 12/22/2013   HCT 43.6 12/22/2013   MCV 93.5 12/22/2013   PLT 196  12/22/2013   NEUTROABS 2.4 12/22/2013      Chemistry      Component Value Date/Time   NA 140 12/22/2013 1205   NA 138 10/21/2011 1354   K 4.9 12/22/2013 1205   K 4.3 10/21/2011 1354   CL 103 12/21/2012 1529   CL 107 10/21/2011 1354   CO2 28 12/22/2013 1205   CO2 23 10/21/2011 1354   BUN 12.9 12/22/2013 1205   BUN 14 10/21/2011 1354   CREATININE 1.1 12/22/2013 1205   CREATININE 1.21  10/21/2011 1354      Component Value Date/Time   CALCIUM 9.5 12/22/2013 1205   CALCIUM 9.2 10/21/2011 1354   ALKPHOS 69 12/22/2013 1205   ALKPHOS 73 10/21/2011 1354   AST 18 12/22/2013 1205   AST 18 10/21/2011 1354   ALT 13 12/22/2013 1205   ALT 16 10/21/2011 1354   BILITOT 0.50 12/22/2013 1205   BILITOT 0.5 10/21/2011 1354     Coagulation profile  Recent Labs Lab 12/22/13 1205  INR 2.40  PROTIME 28.8*    CBC:  Recent Labs Lab 12/22/13 1205  WBC 4.0  NEUTROABS 2.4  HGB 14.2  HCT 43.6  MCV 93.5  PLT 196    Studies:  No results found.   RADIOGRAPHIC STUDIES: 1. CT angiogram of the chest with contrast on 03/15/2005 showed bilateral central and lower lobe segmental pulmonary emboli. There was minimal consolidation/atelectasis in the posterior basal  segments of both lower lobes as well as a tiny left pleural  effusion.  2. CT angiogram of the chest on 03/14/2006 showed no  pulmonary emboli or acute findings.  3. Chest x-ray, two-view, from 11/28/2009 showed no acute or active cardiopulmonary process.  ASSESSMENT: Travis Morgan 63 y.o. male with a history of Pulmonary embolism - Plan: CBC with Differential, Comprehensive metabolic panel (Cmet) - [facility], Protime-INR  Left leg DVT - Plan: CBC with Differential, Comprehensive metabolic panel (Cmet) - [facility], Protime-INR   PLAN:   1.PE/DVT. --He continues to do well. We will continue to check his INR with the anticoagulation clinic with a goal INR between 2 and 3.  His coumadin dose, which has been fairly stable at 6 mg daily now for the last couple of years.   2.  Follow-up. We will plan to see Travis Morgan again in 6 months at which time we will check CBC, chemistries, and pro time.  All questions were answered. The patient knows to call the clinic with any problems, questions or concerns. We can certainly see the patient much sooner if necessary.  I spent 10 minutes counseling the patient face to face. The total time spent in the appointment was 15 minutes.    Concha Norway, MD 12/22/2013 1:13 PM

## 2013-12-22 NOTE — Progress Notes (Signed)
INR within goal today. CBC and CMET are also wnl. No changes in diet or medications. Pt may ask Dr. Juliann Mule if he can start ASA and a MVI today. No problems or concerns regarding anticoagulation. No s/s of swelling noted. No missed coumadin doses. Continue 6mg  daily.    Recheck INR in 4 weeks on 01/20/14 at 3:30pm for lab and 3:30pm for coumadin clinic.

## 2013-12-24 ENCOUNTER — Telehealth: Payer: Self-pay | Admitting: Internal Medicine

## 2013-12-24 NOTE — Telephone Encounter (Signed)
S/w the pt's wife and she is aware of the nov appts

## 2014-01-06 ENCOUNTER — Other Ambulatory Visit: Payer: Self-pay | Admitting: Internal Medicine

## 2014-01-20 ENCOUNTER — Other Ambulatory Visit (HOSPITAL_BASED_OUTPATIENT_CLINIC_OR_DEPARTMENT_OTHER): Payer: Medicare HMO

## 2014-01-20 ENCOUNTER — Ambulatory Visit (HOSPITAL_BASED_OUTPATIENT_CLINIC_OR_DEPARTMENT_OTHER): Payer: Medicare HMO | Admitting: Pharmacist

## 2014-01-20 DIAGNOSIS — I2699 Other pulmonary embolism without acute cor pulmonale: Secondary | ICD-10-CM | POA: Diagnosis not present

## 2014-01-20 DIAGNOSIS — I82402 Acute embolism and thrombosis of unspecified deep veins of left lower extremity: Secondary | ICD-10-CM

## 2014-01-20 LAB — PROTIME-INR
INR: 3.3 (ref 2.00–3.50)
Protime: 39.6 Seconds — ABNORMAL HIGH (ref 10.6–13.4)

## 2014-01-20 LAB — POCT INR: INR: 3.3

## 2014-01-20 NOTE — Patient Instructions (Signed)
Continue 6mg  daily.   Recheck INR in 6 weeks on 03/03/14:  3:30pm for lab and 3:45pm for coumadin clinic.

## 2014-01-20 NOTE — Progress Notes (Signed)
INR within goal today.  Pt doing well. No problems or concerns regarding anticoagulation.  No s/s of clotting noted.  No missed coumadin doses.  No changes in diet or medications. Continue 6mg  daily.   Recheck INR in 6 weeks on 03/03/14:  3:30pm for lab and 3:45pm for coumadin clinic.

## 2014-02-06 ENCOUNTER — Other Ambulatory Visit: Payer: Self-pay | Admitting: Internal Medicine

## 2014-03-03 ENCOUNTER — Other Ambulatory Visit (HOSPITAL_BASED_OUTPATIENT_CLINIC_OR_DEPARTMENT_OTHER): Payer: Medicare HMO

## 2014-03-03 ENCOUNTER — Ambulatory Visit (HOSPITAL_BASED_OUTPATIENT_CLINIC_OR_DEPARTMENT_OTHER): Payer: Medicare HMO | Admitting: Pharmacist

## 2014-03-03 DIAGNOSIS — I2699 Other pulmonary embolism without acute cor pulmonale: Secondary | ICD-10-CM

## 2014-03-03 DIAGNOSIS — I82402 Acute embolism and thrombosis of unspecified deep veins of left lower extremity: Secondary | ICD-10-CM

## 2014-03-03 LAB — PROTIME-INR
INR: 3.3 (ref 2.00–3.50)
PROTIME: 39.6 s — AB (ref 10.6–13.4)

## 2014-03-03 LAB — POCT INR: INR: 3.3

## 2014-03-03 NOTE — Progress Notes (Signed)
INR at goal Pt is doing well with no complaints No unusual bleeding or bruising No missed or extra doses No medication or diet changes Pt is stable on current dose Plan: No changes Continue 6mg  daily.   Recheck INR in 6 weeks on 04/14/14:  2:30pm for lab and 2:45pm for coumadin clinic.

## 2014-03-03 NOTE — Patient Instructions (Signed)
INR at goal No changes Continue 6mg  daily.   Recheck INR in 6 weeks on 04/14/14:  2:30pm for lab and 2:45pm for coumadin clinic.

## 2014-03-08 ENCOUNTER — Other Ambulatory Visit: Payer: Self-pay | Admitting: Internal Medicine

## 2014-04-05 ENCOUNTER — Other Ambulatory Visit: Payer: Self-pay | Admitting: Internal Medicine

## 2014-04-14 ENCOUNTER — Ambulatory Visit (HOSPITAL_BASED_OUTPATIENT_CLINIC_OR_DEPARTMENT_OTHER): Payer: Medicare HMO | Admitting: Internal Medicine

## 2014-04-14 ENCOUNTER — Ambulatory Visit: Payer: Commercial Managed Care - HMO | Admitting: Pharmacist

## 2014-04-14 ENCOUNTER — Other Ambulatory Visit (HOSPITAL_BASED_OUTPATIENT_CLINIC_OR_DEPARTMENT_OTHER): Payer: Medicare HMO

## 2014-04-14 VITALS — BP 155/98 | HR 61 | Temp 97.5°F | Resp 20 | Ht 71.0 in | Wt 179.3 lb

## 2014-04-14 DIAGNOSIS — I2699 Other pulmonary embolism without acute cor pulmonale: Secondary | ICD-10-CM

## 2014-04-14 DIAGNOSIS — I82402 Acute embolism and thrombosis of unspecified deep veins of left lower extremity: Secondary | ICD-10-CM

## 2014-04-14 DIAGNOSIS — I82409 Acute embolism and thrombosis of unspecified deep veins of unspecified lower extremity: Secondary | ICD-10-CM | POA: Diagnosis not present

## 2014-04-14 LAB — COMPREHENSIVE METABOLIC PANEL (CC13)
ALK PHOS: 72 U/L (ref 40–150)
ALT: 18 U/L (ref 0–55)
AST: 21 U/L (ref 5–34)
Albumin: 3.6 g/dL (ref 3.5–5.0)
Anion Gap: 6 mEq/L (ref 3–11)
BUN: 13.6 mg/dL (ref 7.0–26.0)
CO2: 26 mEq/L (ref 22–29)
CREATININE: 1 mg/dL (ref 0.7–1.3)
Calcium: 9.2 mg/dL (ref 8.4–10.4)
Chloride: 106 mEq/L (ref 98–109)
Glucose: 91 mg/dl (ref 70–140)
Potassium: 4.1 mEq/L (ref 3.5–5.1)
Sodium: 139 mEq/L (ref 136–145)
Total Bilirubin: 0.69 mg/dL (ref 0.20–1.20)
Total Protein: 7.5 g/dL (ref 6.4–8.3)

## 2014-04-14 LAB — CBC WITH DIFFERENTIAL/PLATELET
BASO%: 1.3 % (ref 0.0–2.0)
BASOS ABS: 0 10*3/uL (ref 0.0–0.1)
EOS%: 3.6 % (ref 0.0–7.0)
Eosinophils Absolute: 0.1 10*3/uL (ref 0.0–0.5)
HEMATOCRIT: 45.3 % (ref 38.4–49.9)
HGB: 14.6 g/dL (ref 13.0–17.1)
LYMPH%: 36.6 % (ref 14.0–49.0)
MCH: 30.5 pg (ref 27.2–33.4)
MCHC: 32.3 g/dL (ref 32.0–36.0)
MCV: 94.5 fL (ref 79.3–98.0)
MONO#: 0.4 10*3/uL (ref 0.1–0.9)
MONO%: 9.1 % (ref 0.0–14.0)
NEUT#: 1.9 10*3/uL (ref 1.5–6.5)
NEUT%: 49.4 % (ref 39.0–75.0)
Platelets: 214 10*3/uL (ref 140–400)
RBC: 4.79 10*6/uL (ref 4.20–5.82)
RDW: 12.5 % (ref 11.0–14.6)
WBC: 3.9 10*3/uL — ABNORMAL LOW (ref 4.0–10.3)
lymph#: 1.4 10*3/uL (ref 0.9–3.3)

## 2014-04-14 LAB — PROTIME-INR
INR: 2.2 (ref 2.00–3.50)
Protime: 26.4 Seconds — ABNORMAL HIGH (ref 10.6–13.4)

## 2014-04-14 LAB — POCT INR: INR: 2.2

## 2014-04-14 NOTE — Progress Notes (Signed)
[facility] OFFICE PROGRESS NOTE  DIAGNOSIS: Pulmonary embolism - Plan: CBC with Differential, Basic metabolic panel (Bmet) - [facility], Lactate dehydrogenase (LDH) - [facility], Protime-INR  Left leg DVT - Plan: CBC with Differential, Basic metabolic panel (Bmet) - [facility], Lactate dehydrogenase (LDH) - [facility], Protime-INR  Chief Complaint  Patient presents with  . Pulmonary embolism    CURRENT THERAPY: Coumadin 6 mg daily (anticoagulation managed by coumadin clinic).  INTERVAL HISTORY: Travis Morgan 63 y.o. male with a history of pulmonary emboli and DVT without obvious etiology is here for follow-up. Pulmonary emboli were diagnosed in late July 2006. He is compliant to his coumadin. He was last seen by me on 12/22/2013. He has been very compliant with his monitoring. He is without bleeding or bruising. He denies any respiratory problems or leg swelling or pain in the legs. He denies any recent hospitalizations or emergency room visits.   MEDICAL HISTORY:No past medical history on file.  INTERIM HISTORY: has Pulmonary embolism; Left leg DVT; Hypertension; and Other pulmonary embolism and infarction on his problem list.    ALLERGIES:  has No Known Allergies.  MEDICATIONS: has a current medication list which includes the following prescription(s): acetaminophen, lisinopril-hydrochlorothiazide, and warfarin.  SURGICAL HISTORY: No past surgical history on file.  PROBLEM LIST:  1. History of bilateral pulmonary emboli in July 2006 without obvious cause. A hypercoagulable workup at that time was negative.  2. Left leg deep vein thrombosis by Doppler study in  August 2007 when the patient either had a subtherapeutic PT/INR or was off Coumadin.  3. History of right incarcerated inguinal hernia treated with surgery on 11/29/2009.  4. History of cervical spine surgery by Dr. Veatrice Kells in 2003.  5. History of cholesterol gallstones seen on CT scan 03/14/2005.  6. Hypertension.  7.  Anticoagulation therapy with Coumadin.   REVIEW OF SYSTEMS:   Constitutional: Denies fevers, chills or abnormal weight loss Eyes: Denies blurriness of vision Ears, nose, mouth, throat, and face: Denies mucositis or sore throat Respiratory: Denies cough, dyspnea or wheezes Cardiovascular: Denies palpitation, chest discomfort or lower extremity swelling Gastrointestinal:  Denies nausea, heartburn or change in bowel habits Skin: Denies abnormal skin rashes Lymphatics: Denies new lymphadenopathy or easy bruising Neurological:Denies numbness, tingling or new weaknesses Behavioral/Psych: Mood is stable, no new changes  All other systems were reviewed with the patient and are negative.  PHYSICAL EXAMINATION: ECOG PERFORMANCE STATUS: 0 - Asymptomatic  Blood pressure 155/98, pulse 61, temperature 97.5 F (36.4 C), temperature source Oral, resp. rate 20, height 5\' 11"  (1.803 m), weight 179 lb 4.8 oz (81.33 kg).  GENERAL:alert, no distress and comfortable; oily skin SKIN: skin color, texture, turgor are normal, no rashes or significant lesions; Quarter-sized cyst in the middle of his forehead which has been there for many years.   EYES: normal, Conjunctiva are pink and non-injected, sclera clear OROPHARYNX:no exudate, no erythema and lips, buccal mucosa, and tongue normal  NECK: supple, thyroid normal size, non-tender, without nodularity LYMPH:  no palpable lymphadenopathy in the cervical, axillary or supraclavicular LUNGS: clear to auscultation and percussion with normal breathing effort HEART: regular rate & rhythm and no murmurs and no lower extremity edema ABDOMEN:abdomen soft, non-tender and normal bowel sounds Musculoskeletal:no cyanosis of digits and no clubbing  NEURO: alert & oriented x 3 with fluent speech, no focal motor/sensory deficits; Shuffling gait observed.  LABORATORY DATA: Results for orders placed in visit on 04/14/14 (from the past 48 hour(s))  POCT INR  Status: None    Collection Time    04/14/14 12:00 AM      Result Value Ref Range   INR 2.2         Labs:  Lab Results  Component Value Date   WBC 3.9* 04/14/2014   HGB 14.6 04/14/2014   HCT 45.3 04/14/2014   MCV 94.5 04/14/2014   PLT 214 04/14/2014   NEUTROABS 1.9 04/14/2014      Chemistry      Component Value Date/Time   NA 139 04/14/2014 1237   NA 138 10/21/2011 1354   K 4.1 04/14/2014 1237   K 4.3 10/21/2011 1354   CL 103 12/21/2012 1529   CL 107 10/21/2011 1354   CO2 26 04/14/2014 1237   CO2 23 10/21/2011 1354   BUN 13.6 04/14/2014 1237   BUN 14 10/21/2011 1354   CREATININE 1.0 04/14/2014 1237   CREATININE 1.21 10/21/2011 1354      Component Value Date/Time   CALCIUM 9.2 04/14/2014 1237   CALCIUM 9.2 10/21/2011 1354   ALKPHOS 72 04/14/2014 1237   ALKPHOS 73 10/21/2011 1354   AST 21 04/14/2014 1237   AST 18 10/21/2011 1354   ALT 18 04/14/2014 1237   ALT 16 10/21/2011 1354   BILITOT 0.69 04/14/2014 1237   BILITOT 0.5 10/21/2011 1354     Coagulation profile  Recent Labs Lab 04/14/14 04/14/14 1237  INR 2.2 2.20  PROTIME  --  26.4*    CBC:  Recent Labs Lab 04/14/14 1237  WBC 3.9*  NEUTROABS 1.9  HGB 14.6  HCT 45.3  MCV 94.5  PLT 214    Studies:  No results found.   RADIOGRAPHIC STUDIES: 1. CT angiogram of the chest with contrast on 03/15/2005 showed bilateral central and lower lobe segmental pulmonary emboli. There was minimal consolidation/atelectasis in the posterior basal  segments of both lower lobes as well as a tiny left pleural  effusion.  2. CT angiogram of the chest on 03/14/2006 showed no  pulmonary emboli or acute findings.  3. Chest x-ray, two-view, from 11/28/2009 showed no acute or active cardiopulmonary process.  ASSESSMENT: Travis Morgan 63 y.o. male with a history of Pulmonary embolism - Plan: CBC with Differential, Basic metabolic panel (Bmet) - [facility], Lactate dehydrogenase (LDH) - [facility], Protime-INR  Left leg DVT - Plan: CBC with Differential, Basic metabolic panel  (Bmet) - [facility], Lactate dehydrogenase (LDH) - [facility], Protime-INR   PLAN:   1.PE/DVT. --He continues to do well. We will continue to check his INR with the anticoagulation clinic with a goal INR between 2 and 3.  His coumadin dose, which has been fairly stable at 6 mg daily now for the last couple of years.   2. Follow-up. We will plan to see Travis Morgan again in 3 months at which time we will check CBC, chemistries, and pro time.  All questions were answered. The patient knows to call the clinic with any problems, questions or concerns. We can certainly see the patient much sooner if necessary.  I spent 10 minutes counseling the patient face to face. The total time spent in the appointment was 15 minutes.    Shanan Fitzpatrick, MD 04/14/2014 2:54 PM

## 2014-04-14 NOTE — Progress Notes (Signed)
INR = 2.2 on coumadin 6 mg daily Only c/o leg pain/cramps when walking or at night.  No pain presently at rest. No SOB/chest pain. INR a little below goal.  He has been therapeutic on this dose prior to today. No change to dose of Coumadin. Repeat INR in 5 weeks. Kennith Center, Pharm.D., CPP 04/14/2014@2 :21 PM

## 2014-05-07 ENCOUNTER — Other Ambulatory Visit: Payer: Self-pay | Admitting: Internal Medicine

## 2014-05-11 ENCOUNTER — Telehealth: Payer: Self-pay

## 2014-05-11 DIAGNOSIS — I2699 Other pulmonary embolism without acute cor pulmonale: Secondary | ICD-10-CM

## 2014-05-11 MED ORDER — WARFARIN SODIUM 2 MG PO TABS: ORAL_TABLET | ORAL | Status: DC

## 2014-05-11 NOTE — Telephone Encounter (Signed)
Wife called stating they ran out of coumadin on Friday, so he has not had any since then. They called for a refill on Friday and got no response. Called coumadin clinic to verify if we need to change his dosage for know. Gerald Stabs Pharmd said to keep dose same and to have pt keep appt on 10/1. Called wife back with instructions.

## 2014-05-19 ENCOUNTER — Ambulatory Visit (HOSPITAL_BASED_OUTPATIENT_CLINIC_OR_DEPARTMENT_OTHER): Payer: Medicare HMO | Admitting: Pharmacist

## 2014-05-19 ENCOUNTER — Other Ambulatory Visit (HOSPITAL_BASED_OUTPATIENT_CLINIC_OR_DEPARTMENT_OTHER): Payer: Medicare HMO

## 2014-05-19 DIAGNOSIS — Z86711 Personal history of pulmonary embolism: Secondary | ICD-10-CM

## 2014-05-19 DIAGNOSIS — Z7901 Long term (current) use of anticoagulants: Secondary | ICD-10-CM

## 2014-05-19 DIAGNOSIS — I82402 Acute embolism and thrombosis of unspecified deep veins of left lower extremity: Secondary | ICD-10-CM

## 2014-05-19 DIAGNOSIS — I2699 Other pulmonary embolism without acute cor pulmonale: Secondary | ICD-10-CM

## 2014-05-19 DIAGNOSIS — Z86718 Personal history of other venous thrombosis and embolism: Secondary | ICD-10-CM

## 2014-05-19 LAB — PROTIME-INR
INR: 2 (ref 2.00–3.50)
PROTIME: 24 s — AB (ref 10.6–13.4)

## 2014-05-19 LAB — POCT INR: INR: 2

## 2014-05-19 NOTE — Progress Notes (Signed)
INR below goal today at 2.0 (2.2 last visit ~ 5 weeks ago) No changes to report  Pt is doing well with no complaints  No unusual bleeding or bruising  No missed or extra doses  No new meds or diet changes  Although he is slightly subtherapuetic based on his goal 2.5-3.5, he has been on this dose of 6mg  daily since he started coumadin  He is resistant to change as I am hesitant to change at this time. If INR drops further may need dose increase.  He favors routine  Will make no changes  Continue 6mg  daily.  Return in 5 weeks with scheduled appointments on 06/27/14. Lab at 12:30, Coumadin clinic at 12:45pm, and MD visit at 1pm

## 2014-05-19 NOTE — Patient Instructions (Signed)
Continue 6 mg daily Return 06/27/14 lab at 12:30pm, coumadin clinic at 12:45pm and MD visit at 1pm

## 2014-06-08 ENCOUNTER — Other Ambulatory Visit: Payer: Self-pay | Admitting: Hematology

## 2014-06-14 ENCOUNTER — Other Ambulatory Visit: Payer: Self-pay | Admitting: *Deleted

## 2014-06-14 DIAGNOSIS — I82402 Acute embolism and thrombosis of unspecified deep veins of left lower extremity: Secondary | ICD-10-CM

## 2014-06-14 MED ORDER — WARFARIN SODIUM 2 MG PO TABS: ORAL_TABLET | ORAL | Status: DC

## 2014-06-24 ENCOUNTER — Other Ambulatory Visit: Payer: Self-pay | Admitting: *Deleted

## 2014-06-24 DIAGNOSIS — I2699 Other pulmonary embolism without acute cor pulmonale: Secondary | ICD-10-CM

## 2014-06-24 DIAGNOSIS — I82402 Acute embolism and thrombosis of unspecified deep veins of left lower extremity: Secondary | ICD-10-CM

## 2014-06-27 ENCOUNTER — Ambulatory Visit (HOSPITAL_BASED_OUTPATIENT_CLINIC_OR_DEPARTMENT_OTHER): Payer: Medicare HMO | Admitting: Hematology

## 2014-06-27 ENCOUNTER — Other Ambulatory Visit (HOSPITAL_BASED_OUTPATIENT_CLINIC_OR_DEPARTMENT_OTHER): Payer: Medicare HMO

## 2014-06-27 ENCOUNTER — Ambulatory Visit (HOSPITAL_BASED_OUTPATIENT_CLINIC_OR_DEPARTMENT_OTHER): Payer: Self-pay | Admitting: Pharmacist

## 2014-06-27 VITALS — BP 169/89 | HR 61 | Temp 97.8°F | Resp 20 | Ht 71.0 in | Wt 181.5 lb

## 2014-06-27 DIAGNOSIS — Z86711 Personal history of pulmonary embolism: Secondary | ICD-10-CM

## 2014-06-27 DIAGNOSIS — I2699 Other pulmonary embolism without acute cor pulmonale: Secondary | ICD-10-CM

## 2014-06-27 DIAGNOSIS — Z86718 Personal history of other venous thrombosis and embolism: Secondary | ICD-10-CM

## 2014-06-27 DIAGNOSIS — I82402 Acute embolism and thrombosis of unspecified deep veins of left lower extremity: Secondary | ICD-10-CM

## 2014-06-27 LAB — CBC WITH DIFFERENTIAL/PLATELET
BASO%: 1 % (ref 0.0–2.0)
Basophils Absolute: 0 10*3/uL (ref 0.0–0.1)
EOS%: 3.9 % (ref 0.0–7.0)
Eosinophils Absolute: 0.2 10*3/uL (ref 0.0–0.5)
HCT: 46.1 % (ref 38.4–49.9)
HGB: 15.7 g/dL (ref 13.0–17.1)
LYMPH%: 34.3 % (ref 14.0–49.0)
MCH: 31.3 pg (ref 27.2–33.4)
MCHC: 34.1 g/dL (ref 32.0–36.0)
MCV: 91.8 fL (ref 79.3–98.0)
MONO#: 0.3 10*3/uL (ref 0.1–0.9)
MONO%: 8.6 % (ref 0.0–14.0)
NEUT%: 52.2 % (ref 39.0–75.0)
NEUTROS ABS: 2 10*3/uL (ref 1.5–6.5)
Platelets: 188 10*3/uL (ref 140–400)
RBC: 5.02 10*6/uL (ref 4.20–5.82)
RDW: 11.8 % (ref 11.0–14.6)
WBC: 3.8 10*3/uL — AB (ref 4.0–10.3)
lymph#: 1.3 10*3/uL (ref 0.9–3.3)

## 2014-06-27 LAB — PROTIME-INR
INR: 2.3 (ref 2.00–3.50)
Protime: 27.6 Seconds — ABNORMAL HIGH (ref 10.6–13.4)

## 2014-06-27 LAB — BASIC METABOLIC PANEL (CC13)
ANION GAP: 10 meq/L (ref 3–11)
BUN: 14.3 mg/dL (ref 7.0–26.0)
CO2: 28 mEq/L (ref 22–29)
Calcium: 9.7 mg/dL (ref 8.4–10.4)
Chloride: 99 mEq/L (ref 98–109)
Creatinine: 1.3 mg/dL (ref 0.7–1.3)
GLUCOSE: 100 mg/dL (ref 70–140)
POTASSIUM: 4 meq/L (ref 3.5–5.1)
SODIUM: 137 meq/L (ref 136–145)

## 2014-06-27 LAB — POCT INR: INR: 2.3

## 2014-06-27 LAB — LACTATE DEHYDROGENASE (CC13): LDH: 159 U/L (ref 125–245)

## 2014-06-27 NOTE — Progress Notes (Signed)
INR = 2.3 on Coumadin 6 mg daily His only complaint today is having a cold.  He has sneezing and his eyes are itching & red. INR slightly low today but near goal.  He has not required a dose adjustment of his Coumadin in quite some time.  I will continue his dose as-is today as well. He does not mention s/sxs of VTE. Return in 5 weeks. I advised him to use Benadryl or Claritin for allergy sxs rather than Nyquil. Kennith Center, Pharm.D., CPP 06/27/2014@12 :44 PM

## 2014-06-28 ENCOUNTER — Telehealth: Payer: Self-pay | Admitting: Hematology

## 2014-06-28 NOTE — Telephone Encounter (Signed)
, °

## 2014-06-29 ENCOUNTER — Encounter: Payer: Self-pay | Admitting: Hematology

## 2014-06-29 NOTE — Progress Notes (Signed)
[place] HEMATOLOGY OFFICE PROGRESS NOTE DATE OF VISIT: 06/29/2014  DIAGNOSIS: DVT (deep venous thrombosis), left - Plan: CBC with Differential, Comprehensive metabolic panel (Cmet) - [facility]  Pulmonary embolism  Chief Complaint  Patient presents with  . Follow-up    CURRENT THERAPY: Coumadin (anticoagulation managed by coumadin clinic).  INTERVAL HISTORY:  Travis Morgan 63 y.o. male with a history of pulmonary emboli and DVT without obvious etiology is here for follow-up. Pulmonary emboli were diagnosed in late July 2006. He is compliant to his coumadin. He was last seen by Dr Juliann Mule on 04/14/2014. He has been very compliant with his monitoring. He is without bleeding or bruising. He denies any respiratory problems or leg swelling or pain in the legs. He denies any recent hospitalizations or emergency room visits.   He does complain of occasional headaches. He denies smoking. He denies any intravenous drug use. He does admit to drinking beer. His blood pressure in the office was elevated at 169/89.  MEDICAL HISTORY:History reviewed. No pertinent past medical history.  INTERIM HISTORY: has Pulmonary embolism; Left leg DVT; Hypertension; and Other pulmonary embolism and infarction on his problem list.    ALLERGIES:  has No Known Allergies.  MEDICATIONS: has a current medication list which includes the following prescription(s): acetaminophen, lisinopril-hydrochlorothiazide, and warfarin.  SURGICAL HISTORY: History reviewed. No pertinent past surgical history.  PROBLEM LIST:  1. History of bilateral pulmonary emboli in July 2006 without obvious cause. A hypercoagulable workup at that time was negative.  2. Left leg deep vein thrombosis by Doppler study in  August 2007 when the patient either had a subtherapeutic PT/INR or was off Coumadin.  3. History of right incarcerated inguinal hernia treated with surgery on 11/29/2009.  4. History of cervical spine surgery by  Dr. Veatrice Kells in 2003.  5. History of cholesterol gallstones seen on CT scan 03/14/2005.  6. Hypertension.  7. Anticoagulation therapy with Coumadin.   REVIEW OF SYSTEMS:   Constitutional: Denies fevers, chills or abnormal weight loss Eyes: Denies blurriness of vision Ears, nose, mouth, throat, and face: Denies mucositis or sore throat Respiratory: Denies cough, dyspnea or wheezes Cardiovascular: Denies palpitation, chest discomfort or lower extremity swelling Gastrointestinal:  Denies nausea, heartburn or change in bowel habits Skin: Denies abnormal skin rashes Lymphatics: Denies new lymphadenopathy or easy bruising Neurological:Denies numbness, tingling or new weaknesses Behavioral/Psych: Mood is stable, no new changes  All other systems were reviewed with the patient and are negative.  PHYSICAL EXAMINATION: ECOG PERFORMANCE STATUS: 0  Blood pressure 169/89, pulse 61, temperature 97.8 F (36.6 C), temperature source Oral, resp. rate 20, height 5\' 11"  (1.803 m), weight 181 lb 8 oz (82.328 kg), SpO2 100 %.  GENERAL:alert, no distress and comfortable; oily skin SKIN: skin color, texture, turgor are normal, no rashes or significant lesions; Quarter-sized cyst in the middle of his forehead which has been there for many years.   EYES: normal, Conjunctiva are pink and non-injected, sclera clear OROPHARYNX:no exudate, no erythema and lips, buccal mucosa, and tongue normal  NECK: supple, thyroid normal size, non-tender, without nodularity LYMPH:  no palpable lymphadenopathy in the cervical, axillary or supraclavicular LUNGS: clear to auscultation and percussion with normal breathing effort HEART: regular rate & rhythm and no murmurs and no lower extremity edema ABDOMEN:abdomen soft, non-tender and normal bowel sounds Musculoskeletal:no cyanosis of digits and no clubbing  NEURO: alert & oriented x 3 with fluent speech, no focal motor/sensory deficits; Shuffling gait  observed.  LABORATORY DATA:  No results found for this or any previous visit (from the past 48 hour(s)).     Labs:  Lab Results  Component Value Date   WBC 3.8* 06/27/2014   HGB 15.7 06/27/2014   HCT 46.1 06/27/2014   MCV 91.8 06/27/2014   PLT 188 06/27/2014   NEUTROABS 2.0 06/27/2014      Chemistry      Component Value Date/Time   NA 137 06/27/2014 1207   NA 138 10/21/2011 1354   K 4.0 06/27/2014 1207   K 4.3 10/21/2011 1354   CL 103 12/21/2012 1529   CL 107 10/21/2011 1354   CO2 28 06/27/2014 1207   CO2 23 10/21/2011 1354   BUN 14.3 06/27/2014 1207   BUN 14 10/21/2011 1354   CREATININE 1.3 06/27/2014 1207   CREATININE 1.21 10/21/2011 1354      Component Value Date/Time   CALCIUM 9.7 06/27/2014 1207   CALCIUM 9.2 10/21/2011 1354   ALKPHOS 72 04/14/2014 1237   ALKPHOS 73 10/21/2011 1354   AST 21 04/14/2014 1237   AST 18 10/21/2011 1354   ALT 18 04/14/2014 1237   ALT 16 10/21/2011 1354   BILITOT 0.69 04/14/2014 1237   BILITOT 0.5 10/21/2011 1354     Coagulation profile  Recent Labs Lab 06/27/14 06/27/14 1207  INR 2.3 2.30  PROTIME  --  27.6*    CBC:  Recent Labs Lab 06/27/14 1207  WBC 3.8*  NEUTROABS 2.0  HGB 15.7  HCT 46.1  MCV 91.8  PLT 188    Studies:  No results found.   RADIOGRAPHIC STUDIES: 1. CT angiogram of the chest with contrast on 03/15/2005 showed bilateral central and lower lobe segmental pulmonary emboli. There was minimal consolidation/atelectasis in the posterior basal  segments of both lower lobes as well as a tiny left pleural  effusion.  2. CT angiogram of the chest on 03/14/2006 showed no  pulmonary emboli or acute findings.  3. Chest x-ray, two-view, from 11/28/2009 showed no acute or active cardiopulmonary process.  ASSESSMENT: Travis Morgan 63 y.o. male with a history of DVT (deep venous thrombosis), left - Plan: CBC with Differential, Comprehensive metabolic panel (Cmet) - [facility]  Pulmonary embolism   PLAN:    1.PE/DVT. --He continues to do well. We will continue to check his INR with the anticoagulation clinic with a goal INR between 2 and 3.  His coumadin dose, which has been fairly stable at 6 mg daily now for the last couple of years.   2. Follow-up. We will plan to see Travis Morgan again in 6 months at which time we will check CBC, chemistries, and pro time.  All questions were answered. The patient knows to call the clinic with any problems, questions or concerns. We can certainly see the patient much sooner if necessary.  I spent 10 minutes counseling the patient face to face. The total time spent in the appointment was 15 minutes.    Bernadene Bell, MD Medical Hematologist/Oncologist [place] Pager: [phone] Office No: [phone]

## 2014-07-11 ENCOUNTER — Other Ambulatory Visit: Payer: Self-pay | Admitting: Hematology

## 2014-07-11 DIAGNOSIS — I2699 Other pulmonary embolism without acute cor pulmonale: Secondary | ICD-10-CM

## 2014-08-04 ENCOUNTER — Other Ambulatory Visit (HOSPITAL_BASED_OUTPATIENT_CLINIC_OR_DEPARTMENT_OTHER): Payer: Medicare HMO

## 2014-08-04 ENCOUNTER — Ambulatory Visit (HOSPITAL_BASED_OUTPATIENT_CLINIC_OR_DEPARTMENT_OTHER): Payer: Medicare HMO | Admitting: Pharmacist

## 2014-08-04 DIAGNOSIS — Z86711 Personal history of pulmonary embolism: Secondary | ICD-10-CM | POA: Diagnosis not present

## 2014-08-04 DIAGNOSIS — I2699 Other pulmonary embolism without acute cor pulmonale: Secondary | ICD-10-CM

## 2014-08-04 LAB — PROTIME-INR
INR: 2.3 (ref 2.00–3.50)
PROTIME: 27.6 s — AB (ref 10.6–13.4)

## 2014-08-04 LAB — POCT INR: INR: 2.3

## 2014-08-04 NOTE — Patient Instructions (Signed)
INR right at goal No changes Continue 6mg  daily.   Recheck INR in 5 weeks on 09/15/14:  1:15 pm for lab and 1:30 pm for coumadin clinic.

## 2014-08-04 NOTE — Progress Notes (Signed)
INR right at goal Pt is doing well with no complaints No missed or extra doses No unusual bleeding or bruising No medication or diet changes Plan: Continue 6mg  daily.   Recheck INR in 5 weeks on 09/15/14:  1:15 pm for lab and 1:30 pm for coumadin clinic.

## 2014-08-15 ENCOUNTER — Other Ambulatory Visit (HOSPITAL_BASED_OUTPATIENT_CLINIC_OR_DEPARTMENT_OTHER): Payer: Medicare HMO

## 2014-08-15 ENCOUNTER — Ambulatory Visit (HOSPITAL_BASED_OUTPATIENT_CLINIC_OR_DEPARTMENT_OTHER): Payer: Commercial Managed Care - HMO | Admitting: Pharmacist

## 2014-08-15 DIAGNOSIS — Z86711 Personal history of pulmonary embolism: Secondary | ICD-10-CM

## 2014-08-15 DIAGNOSIS — I2699 Other pulmonary embolism without acute cor pulmonale: Secondary | ICD-10-CM

## 2014-08-15 LAB — PROTIME-INR
INR: 2.9 (ref 2.00–3.50)
Protime: 34.8 Seconds — ABNORMAL HIGH (ref 10.6–13.4)

## 2014-08-15 LAB — POCT INR: INR: 2.9

## 2014-08-15 NOTE — Progress Notes (Signed)
NO CHARGE - PATIENT SCHEDULED ON WRONG DATE!  INR = 2.9   Goal 2.5-3.5 INR within goal range. No complications of anticoagulation noted. Patient was to return on 09/15/14, appt scheduled 1 month early and should have been for 09/15/14. He will continue 6mg  daily.   Recheck INR in 5 weeks on 09/22/14:  12 pm for lab and 12:15 pm for Coumadin clinic.  Theone Murdoch, PharmD

## 2014-09-15 ENCOUNTER — Other Ambulatory Visit: Payer: Commercial Managed Care - HMO

## 2014-09-15 ENCOUNTER — Ambulatory Visit: Payer: Commercial Managed Care - HMO

## 2014-09-22 ENCOUNTER — Ambulatory Visit (HOSPITAL_BASED_OUTPATIENT_CLINIC_OR_DEPARTMENT_OTHER): Payer: Medicare HMO | Admitting: Pharmacist

## 2014-09-22 DIAGNOSIS — Z86718 Personal history of other venous thrombosis and embolism: Secondary | ICD-10-CM

## 2014-09-22 DIAGNOSIS — I82402 Acute embolism and thrombosis of unspecified deep veins of left lower extremity: Secondary | ICD-10-CM

## 2014-09-22 DIAGNOSIS — Z86711 Personal history of pulmonary embolism: Secondary | ICD-10-CM

## 2014-09-22 LAB — CBC WITH DIFFERENTIAL/PLATELET
BASO%: 1 % (ref 0.0–2.0)
Basophils Absolute: 0 10*3/uL (ref 0.0–0.1)
EOS ABS: 0.1 10*3/uL (ref 0.0–0.5)
EOS%: 3.1 % (ref 0.0–7.0)
HCT: 41.7 % (ref 38.4–49.9)
HGB: 14.1 g/dL (ref 13.0–17.1)
LYMPH#: 1.3 10*3/uL (ref 0.9–3.3)
LYMPH%: 31.5 % (ref 14.0–49.0)
MCH: 31.5 pg (ref 27.2–33.4)
MCHC: 33.8 g/dL (ref 32.0–36.0)
MCV: 93.3 fL (ref 79.3–98.0)
MONO#: 0.3 10*3/uL (ref 0.1–0.9)
MONO%: 7.9 % (ref 0.0–14.0)
NEUT%: 56.5 % (ref 39.0–75.0)
NEUTROS ABS: 2.4 10*3/uL (ref 1.5–6.5)
Platelets: 200 10*3/uL (ref 140–400)
RBC: 4.47 10*6/uL (ref 4.20–5.82)
RDW: 12 % (ref 11.0–14.6)
WBC: 4.2 10*3/uL (ref 4.0–10.3)
nRBC: 1 % — ABNORMAL HIGH (ref 0–0)

## 2014-09-22 LAB — PROTIME-INR
INR: 2.1 (ref 2.00–3.50)
Protime: 25.2 Seconds — ABNORMAL HIGH (ref 10.6–13.4)

## 2014-09-22 LAB — POCT INR: INR: 2.1

## 2014-09-22 NOTE — Patient Instructions (Signed)
Continue 6mg  daily.   Recheck INR in 5 weeks on 10/27/14:  12 pm for lab and 12:15 pm for Coumadin clinic.

## 2014-09-22 NOTE — Progress Notes (Signed)
PT seen in clinic today INR=2.1 Although patient slightly subtherapeutic he does not want to mess with his dose He has actually been on this dose for over 2.5 years.   No other changes to report Will continue same dose of 6mg  daily RTC in 5 weeks on 10/27/14 lab 12:00 and CC at 12:15

## 2014-10-27 ENCOUNTER — Other Ambulatory Visit: Payer: Commercial Managed Care - HMO

## 2014-10-27 ENCOUNTER — Ambulatory Visit: Payer: Commercial Managed Care - HMO

## 2014-10-28 ENCOUNTER — Encounter: Payer: Self-pay | Admitting: Pharmacist

## 2014-10-28 NOTE — Progress Notes (Signed)
Called patient to r/s missed CC appmt R/S for 10/31/14 at 2:00

## 2014-10-31 ENCOUNTER — Ambulatory Visit (HOSPITAL_BASED_OUTPATIENT_CLINIC_OR_DEPARTMENT_OTHER): Payer: Medicare HMO | Admitting: Pharmacist

## 2014-10-31 ENCOUNTER — Other Ambulatory Visit (HOSPITAL_BASED_OUTPATIENT_CLINIC_OR_DEPARTMENT_OTHER): Payer: Medicare HMO

## 2014-10-31 DIAGNOSIS — Z86711 Personal history of pulmonary embolism: Secondary | ICD-10-CM

## 2014-10-31 DIAGNOSIS — Z86718 Personal history of other venous thrombosis and embolism: Secondary | ICD-10-CM

## 2014-10-31 DIAGNOSIS — I2699 Other pulmonary embolism without acute cor pulmonale: Secondary | ICD-10-CM

## 2014-10-31 LAB — PROTIME-INR
INR: 1.7 — ABNORMAL LOW (ref 2.00–3.50)
PROTIME: 20.4 s — AB (ref 10.6–13.4)

## 2014-10-31 LAB — POCT INR: INR: 1.7

## 2014-10-31 NOTE — Progress Notes (Signed)
INR below goal today at 1.7 Travis Morgan is doing well He reports missing at least 1 dose in the last few days and maybe 2 due to running out of coumadin He has gotten his refill now and is back taking 6 mg daily He is really not open to change and does not like taking extra coumadin when his INR is slightly below goal No unusual bleeding or bruising No medication or diet changes If INR remains below goal at next visit will need to dose increase Plans: No changes Continue 6mg  daily.   Recheck INR in 4 weeks on 12/01/14:  1:30pm for lab and 1:45pm for coumadin clinic

## 2014-10-31 NOTE — Patient Instructions (Signed)
INR below goal No changes Continue 6mg  daily.   Recheck INR in 5 weeks on 12/01/14:  1:30pm for lab and 1:45pm for coumadin clinic

## 2014-11-20 ENCOUNTER — Other Ambulatory Visit: Payer: Self-pay | Admitting: Hematology

## 2014-11-24 NOTE — Telephone Encounter (Signed)
Received vm call from pt's wife re: medication refills. Called back. No answer. Requested call back on vm.

## 2014-11-25 ENCOUNTER — Other Ambulatory Visit: Payer: Self-pay | Admitting: Pharmacist

## 2014-11-25 DIAGNOSIS — I2699 Other pulmonary embolism without acute cor pulmonale: Secondary | ICD-10-CM

## 2014-11-25 MED ORDER — WARFARIN SODIUM 2 MG PO TABS: 6.0000 mg | ORAL_TABLET | Freq: Every day | ORAL | Status: DC

## 2014-11-30 ENCOUNTER — Other Ambulatory Visit: Payer: Self-pay | Admitting: *Deleted

## 2014-11-30 DIAGNOSIS — I2699 Other pulmonary embolism without acute cor pulmonale: Secondary | ICD-10-CM

## 2014-11-30 NOTE — Telephone Encounter (Signed)
TCT patient to discuss refill for Coumadin. No answer. Left VM for patient to call back

## 2014-12-01 ENCOUNTER — Ambulatory Visit (HOSPITAL_BASED_OUTPATIENT_CLINIC_OR_DEPARTMENT_OTHER): Payer: Medicare HMO | Admitting: Pharmacist

## 2014-12-01 ENCOUNTER — Other Ambulatory Visit (HOSPITAL_BASED_OUTPATIENT_CLINIC_OR_DEPARTMENT_OTHER): Payer: Medicare HMO

## 2014-12-01 DIAGNOSIS — I82402 Acute embolism and thrombosis of unspecified deep veins of left lower extremity: Secondary | ICD-10-CM

## 2014-12-01 DIAGNOSIS — Z86718 Personal history of other venous thrombosis and embolism: Secondary | ICD-10-CM

## 2014-12-01 LAB — PROTIME-INR
INR: 1.6 — AB (ref 2.00–3.50)
Protime: 19.2 Seconds — ABNORMAL HIGH (ref 10.6–13.4)

## 2014-12-01 LAB — POCT INR: INR: 1.6

## 2014-12-01 NOTE — Progress Notes (Signed)
INR below goal today at 1.6 (goal 2.5-3.5) Pt has had issues with compliance the last few months Pt ran out of refills and missed 2 doses in the last week before new refills were obtained Pt is now back on his coumadin of 6 mg daily No other issues or changes to report Pt is insistent on continue 6 mg daily but he did agree to take 8 mg tonight as a one time dose No unusual bleeding or bruising and no S/Sx of clotting Instructed patient to call coumadin clinic if he is close to running out of refills (he should have a 3 month supply of refills at [facility] as of last week) Plan: Take 8 mg today only then Continue 6mg  daily.   Recheck INR in 4 weeks on 12/26/14:  9am for lab and 9:15am for coumadin clinic and MD visit with Dr. Julien Nordmann at 9:30am

## 2014-12-01 NOTE — Patient Instructions (Signed)
INR below goal today Take 8 mg today only then Continue 6mg  daily.   Recheck INR in 4 weeks on 12/26/14:  9am for lab and 9:15am for coumadin clinic and MD visit with Dr. Julien Nordmann at 9:30am

## 2014-12-22 ENCOUNTER — Other Ambulatory Visit: Payer: Self-pay | Admitting: *Deleted

## 2014-12-22 DIAGNOSIS — I2699 Other pulmonary embolism without acute cor pulmonale: Secondary | ICD-10-CM

## 2014-12-26 ENCOUNTER — Ambulatory Visit (HOSPITAL_BASED_OUTPATIENT_CLINIC_OR_DEPARTMENT_OTHER): Payer: Medicare HMO | Admitting: Pharmacist

## 2014-12-26 ENCOUNTER — Encounter: Payer: Self-pay | Admitting: Internal Medicine

## 2014-12-26 ENCOUNTER — Other Ambulatory Visit (HOSPITAL_BASED_OUTPATIENT_CLINIC_OR_DEPARTMENT_OTHER): Payer: Medicare HMO

## 2014-12-26 ENCOUNTER — Telehealth: Payer: Self-pay | Admitting: Internal Medicine

## 2014-12-26 ENCOUNTER — Ambulatory Visit (HOSPITAL_BASED_OUTPATIENT_CLINIC_OR_DEPARTMENT_OTHER): Payer: Medicare HMO | Admitting: Internal Medicine

## 2014-12-26 VITALS — BP 139/83 | HR 58 | Temp 98.6°F | Resp 18 | Ht 71.0 in | Wt 177.0 lb

## 2014-12-26 DIAGNOSIS — Z86718 Personal history of other venous thrombosis and embolism: Secondary | ICD-10-CM

## 2014-12-26 DIAGNOSIS — Z7901 Long term (current) use of anticoagulants: Secondary | ICD-10-CM | POA: Diagnosis not present

## 2014-12-26 DIAGNOSIS — I2699 Other pulmonary embolism without acute cor pulmonale: Secondary | ICD-10-CM

## 2014-12-26 DIAGNOSIS — I82402 Acute embolism and thrombosis of unspecified deep veins of left lower extremity: Secondary | ICD-10-CM

## 2014-12-26 DIAGNOSIS — Z86711 Personal history of pulmonary embolism: Secondary | ICD-10-CM

## 2014-12-26 LAB — PROTIME-INR
INR: 2.7 (ref 2.00–3.50)
Protime: 32.4 Seconds — ABNORMAL HIGH (ref 10.6–13.4)

## 2014-12-26 LAB — COMPREHENSIVE METABOLIC PANEL (CC13)
ALBUMIN: 3.6 g/dL (ref 3.5–5.0)
ALT: 24 U/L (ref 0–55)
ANION GAP: 8 meq/L (ref 3–11)
AST: 19 U/L (ref 5–34)
Alkaline Phosphatase: 74 U/L (ref 40–150)
BUN: 12.3 mg/dL (ref 7.0–26.0)
CALCIUM: 9.1 mg/dL (ref 8.4–10.4)
CHLORIDE: 107 meq/L (ref 98–109)
CO2: 25 mEq/L (ref 22–29)
Creatinine: 1.2 mg/dL (ref 0.7–1.3)
EGFR: 79 mL/min/{1.73_m2} — ABNORMAL LOW (ref >90)
Glucose: 104 mg/dl (ref 70–140)
POTASSIUM: 4.1 meq/L (ref 3.5–5.1)
Sodium: 141 mEq/L (ref 136–145)
TOTAL PROTEIN: 6.8 g/dL (ref 6.4–8.3)
Total Bilirubin: 0.47 mg/dL (ref 0.20–1.20)

## 2014-12-26 LAB — CBC WITH DIFFERENTIAL/PLATELET
BASO%: 1.3 % (ref 0.0–2.0)
Basophils Absolute: 0.1 10*3/uL (ref 0.0–0.1)
EOS%: 4.5 % (ref 0.0–7.0)
Eosinophils Absolute: 0.2 10*3/uL (ref 0.0–0.5)
HCT: 43.4 % (ref 38.4–49.9)
HGB: 14.6 g/dL (ref 13.0–17.1)
LYMPH#: 1.2 10*3/uL (ref 0.9–3.3)
LYMPH%: 31.1 % (ref 14.0–49.0)
MCH: 31.4 pg (ref 27.2–33.4)
MCHC: 33.6 g/dL (ref 32.0–36.0)
MCV: 93.3 fL (ref 79.3–98.0)
MONO#: 0.3 10*3/uL (ref 0.1–0.9)
MONO%: 6.8 % (ref 0.0–14.0)
NEUT%: 56.3 % (ref 39.0–75.0)
NEUTROS ABS: 2.2 10*3/uL (ref 1.5–6.5)
Platelets: 196 10*3/uL (ref 140–400)
RBC: 4.65 10*6/uL (ref 4.20–5.82)
RDW: 12 % (ref 11.0–14.6)
WBC: 4 10*3/uL (ref 4.0–10.3)

## 2014-12-26 LAB — POCT INR: INR: 2.7

## 2014-12-26 NOTE — Progress Notes (Signed)
Pt seen in clinic today prior to his MD appmt  INR=2.7 with goal 2.5-3.5 No new meds  Diet consistent He states he is taking 6mg  daily and has not missed any doses Preference is afternoon appmts  Continue Coumadin 6mg  daily.   Recheck INR in 4 weeks on 01/26/15 with 1:30pm for lab and 1:45pm for coumadin clinic

## 2014-12-26 NOTE — Patient Instructions (Signed)
Continue Coumadin 6mg  daily.   Recheck INR in 4 weeks on 01/26/15:  1:30pm for lab and 1:45pm for coumadin clinic

## 2014-12-26 NOTE — Progress Notes (Signed)
[place] [phone]   Fax:([phone]) [phone]  OFFICE PROGRESS NOTE  No PCP Per Patient No address on file  DIAGNOSIS: History of deep venous thrombosis and pulmonary embolism diagnosed in 2006.  PRIOR THERAPY: None  CURRENT THERAPY: Coumadin 6 mg by mouth daily.  INTERVAL HISTORY: Travis Morgan 64 y.o. male returns to the clinic today for six-month follow-up visit. He is a former patient of Dr. Beryle Beams and Dr. Juliann Mule who was followed for anticoagulation treatment. The patient mentions that he was diagnosed deep venous thrombosis and pulmonary embolism in 2006 of unclear underlying cause. Hypercoagulable workup performed at that time was unremarkable. The patient was started on anticoagulation with Coumadin and has been on this treatment for the last 10 years. He is rating his treatment fairly well with no significant adverse effects. He denied having any bleeding issues. He has no chest pain, shortness breath, cough or hemoptysis. He has some imbalance of his gait secondary to generalized weakness. He is here today to establish care with me with repeat blood work.  MEDICAL HISTORY:History reviewed. No pertinent past medical history.  ALLERGIES:  has No Known Allergies.  MEDICATIONS:  Current Outpatient Prescriptions  Medication Sig Dispense Refill  . acetaminophen (TYLENOL) 500 MG tablet Take 500 mg by mouth every 6 (six) hours as needed.    Marland Kitchen lisinopril-hydrochlorothiazide (PRINZIDE,ZESTORETIC) 20-12.5 MG per tablet Take 1 tablet by mouth daily.    Marland Kitchen warfarin (COUMADIN) 2 MG tablet Take 3 tablets (6 mg total) by mouth daily. 90 tablet 3   No current facility-administered medications for this visit.    SURGICAL HISTORY: History reviewed. No pertinent past surgical history.  REVIEW OF SYSTEMS:  A comprehensive review of systems was negative except for: Musculoskeletal: positive for muscle weakness   PHYSICAL EXAMINATION: General appearance:  alert, cooperative and no distress Head: Normocephalic, without obvious abnormality, atraumatic Neck: no adenopathy, no JVD, supple, symmetrical, trachea midline and thyroid not enlarged, symmetric, no tenderness/mass/nodules Lymph nodes: Cervical, supraclavicular, and axillary nodes normal. Resp: clear to auscultation bilaterally Back: symmetric, no curvature. ROM normal. No CVA tenderness. Cardio: regular rate and rhythm, S1, S2 normal, no murmur, click, rub or gallop GI: soft, non-tender; bowel sounds normal; no masses,  no organomegaly Extremities: extremities normal, atraumatic, no cyanosis or edema  ECOG PERFORMANCE STATUS: 1 - Symptomatic but completely ambulatory  Blood pressure 139/83, pulse 58, temperature 98.6 F (37 C), temperature source Oral, resp. rate 18, height 5\' 11"  (1.803 m), weight 177 lb (80.287 kg), SpO2 100 %.  LABORATORY DATA: Lab Results  Component Value Date   WBC 4.0 12/26/2014   HGB 14.6 12/26/2014   HCT 43.4 12/26/2014   MCV 93.3 12/26/2014   PLT 196 12/26/2014      Chemistry      Component Value Date/Time   NA 141 12/26/2014 0829   NA 138 10/21/2011 1354   K 4.1 12/26/2014 0829   K 4.3 10/21/2011 1354   CL 103 12/21/2012 1529   CL 107 10/21/2011 1354   CO2 25 12/26/2014 0829   CO2 23 10/21/2011 1354   BUN 12.3 12/26/2014 0829   BUN 14 10/21/2011 1354   CREATININE 1.2 12/26/2014 0829   CREATININE 1.21 10/21/2011 1354      Component Value Date/Time   CALCIUM 9.1 12/26/2014 0829   CALCIUM 9.2 10/21/2011 1354   ALKPHOS 74 12/26/2014 0829   ALKPHOS 73 10/21/2011 1354   AST 19 12/26/2014 0829   AST 18  10/21/2011 1354   ALT 24 12/26/2014 0829   ALT 16 10/21/2011 1354   BILITOT 0.47 12/26/2014 0829   BILITOT 0.5 10/21/2011 1354       RADIOGRAPHIC STUDIES: No results found.  ASSESSMENT AND PLAN: This is a very pleasant 64 years old African-American male with history of deep venous thrombosis and pulmonary embolism of unclear underlying  cause diagnosed in 2006 and has been on anticoagulation with Coumadin since that time. His hypercoagulable workup at that time was unremarkable. I had a lengthy discussion with the patient today about his condition. I explained to the patient that I have no reason to keep him on anticoagulation for life especially with the absence of any underlying hypercoagulable abnormality but the patient is worried about recurrence of pulmonary emboli and he does not like to take any chances. He would like to continue on treatment with Coumadin. Because of his concern, I agree to keep the patient and his anticoagulation for now. He will be followed by the Coumadin clinic for monitoring of his dose. I will continue to follow up the patient every 6 months. He was advised to call if he has any concerning symptoms in the interval. The patient voices understanding of current disease status and treatment options and is in agreement with the current care plan.  All questions were answered. The patient knows to call the clinic with any problems, questions or concerns. We can certainly see the patient much sooner if necessary.  Disclaimer: This note was dictated with voice recognition software. Similar sounding words can inadvertently be transcribed and may not be corrected upon review.

## 2014-12-26 NOTE — Telephone Encounter (Signed)
lvm for pt regarding to NOV appt...mailed pt appt sched/avs and letter °

## 2014-12-27 ENCOUNTER — Ambulatory Visit: Payer: Medicare HMO

## 2014-12-27 ENCOUNTER — Other Ambulatory Visit: Payer: Medicare HMO

## 2015-01-26 ENCOUNTER — Ambulatory Visit: Payer: Medicare HMO

## 2015-01-26 ENCOUNTER — Telehealth: Payer: Self-pay

## 2015-01-26 ENCOUNTER — Other Ambulatory Visit: Payer: Medicare HMO

## 2015-01-26 NOTE — Telephone Encounter (Signed)
Called Travis Morgan after he FKTA this afternoon, and I talked with his wife.  She said they had received a message that his doctor's appointment was moved to November and thought that meant his Coumadin Clinic appointment got moved as well, but it was only for his doctor's appointment. We rescheduled him for Tuesday 6/14 @ 10 am per patient request.

## 2015-01-31 ENCOUNTER — Other Ambulatory Visit (HOSPITAL_BASED_OUTPATIENT_CLINIC_OR_DEPARTMENT_OTHER): Payer: Medicare HMO

## 2015-01-31 ENCOUNTER — Ambulatory Visit (HOSPITAL_BASED_OUTPATIENT_CLINIC_OR_DEPARTMENT_OTHER): Payer: Medicare HMO | Admitting: Pharmacist

## 2015-01-31 DIAGNOSIS — Z86711 Personal history of pulmonary embolism: Secondary | ICD-10-CM | POA: Diagnosis not present

## 2015-01-31 DIAGNOSIS — I2699 Other pulmonary embolism without acute cor pulmonale: Secondary | ICD-10-CM

## 2015-01-31 LAB — PROTIME-INR
INR: 2.5 (ref 2.00–3.50)
PROTIME: 30 s — AB (ref 10.6–13.4)

## 2015-01-31 NOTE — Patient Instructions (Addendum)
INR at goal Continue Coumadin 6mg  daily.   Recheck INR in 6 weeks on 03/16/15:  1:00pm for lab and 1:15pm for coumadin clinic

## 2015-01-31 NOTE — Progress Notes (Signed)
INR at goal today at 2.5 (goal 2.5-3.5) Travis Morgan is doing well today with no complaints No missed or extra doses No diet or medication changes No unusual bleeding or bruising Plan: Continue Coumadin 6mg  daily.   Recheck INR in 6 weeks on 03/16/15:  1:00pm for lab and 1:15pm for coumadin clinic

## 2015-03-16 ENCOUNTER — Other Ambulatory Visit (HOSPITAL_BASED_OUTPATIENT_CLINIC_OR_DEPARTMENT_OTHER): Payer: Medicare HMO

## 2015-03-16 ENCOUNTER — Ambulatory Visit (HOSPITAL_BASED_OUTPATIENT_CLINIC_OR_DEPARTMENT_OTHER): Payer: Medicare HMO | Admitting: Pharmacist

## 2015-03-16 DIAGNOSIS — I2699 Other pulmonary embolism without acute cor pulmonale: Secondary | ICD-10-CM

## 2015-03-16 DIAGNOSIS — Z86711 Personal history of pulmonary embolism: Secondary | ICD-10-CM | POA: Diagnosis not present

## 2015-03-16 LAB — PROTIME-INR
INR: 2.4 (ref 2.00–3.50)
PROTIME: 28.8 s — AB (ref 10.6–13.4)

## 2015-03-16 LAB — POCT INR: INR: 2.4

## 2015-03-16 NOTE — Patient Instructions (Signed)
Continue Coumadin 6mg  daily.   Recheck INR in 5 weeks on 04/20/15:1:00pm for lab and 1:15pm for coumadin clinic.

## 2015-03-16 NOTE — Progress Notes (Signed)
INR essentially at goal today. Pt has been taking coumadin as instructed. No missed or extra coumadin doses. No changes in diet or medications. No unusual bruising. No bleeding noted. No s/s of clotting noted. No problems or concerns regarding anticoagulation. Continue Coumadin 6mg  daily.   This has been a very stable dose for patient. Recheck INR in 5 weeks on 04/20/15:1:00pm for lab and 1:15pm for coumadin clinic. Will order CBC for review with next visit.

## 2015-04-01 ENCOUNTER — Other Ambulatory Visit: Payer: Self-pay | Admitting: Internal Medicine

## 2015-04-20 ENCOUNTER — Other Ambulatory Visit (HOSPITAL_BASED_OUTPATIENT_CLINIC_OR_DEPARTMENT_OTHER): Payer: Medicare HMO

## 2015-04-20 ENCOUNTER — Ambulatory Visit (HOSPITAL_BASED_OUTPATIENT_CLINIC_OR_DEPARTMENT_OTHER): Payer: Medicare HMO | Admitting: Pharmacist

## 2015-04-20 DIAGNOSIS — Z86711 Personal history of pulmonary embolism: Secondary | ICD-10-CM | POA: Diagnosis not present

## 2015-04-20 DIAGNOSIS — I2699 Other pulmonary embolism without acute cor pulmonale: Secondary | ICD-10-CM

## 2015-04-20 LAB — CBC WITH DIFFERENTIAL/PLATELET
BASO%: 0.7 % (ref 0.0–2.0)
Basophils Absolute: 0 10*3/uL (ref 0.0–0.1)
EOS%: 2.5 % (ref 0.0–7.0)
Eosinophils Absolute: 0.1 10*3/uL (ref 0.0–0.5)
HCT: 44.1 % (ref 38.4–49.9)
HGB: 15.4 g/dL (ref 13.0–17.1)
LYMPH#: 1.5 10*3/uL (ref 0.9–3.3)
LYMPH%: 32.5 % (ref 14.0–49.0)
MCH: 31.8 pg (ref 27.2–33.4)
MCHC: 34.9 g/dL (ref 32.0–36.0)
MCV: 91.1 fL (ref 79.3–98.0)
MONO#: 0.5 10*3/uL (ref 0.1–0.9)
MONO%: 10.5 % (ref 0.0–14.0)
NEUT%: 53.8 % (ref 39.0–75.0)
NEUTROS ABS: 2.4 10*3/uL (ref 1.5–6.5)
NRBC: 0 % (ref 0–0)
Platelets: 190 10*3/uL (ref 140–400)
RBC: 4.84 10*6/uL (ref 4.20–5.82)
RDW: 11.7 % (ref 11.0–14.6)
WBC: 4.5 10*3/uL (ref 4.0–10.3)

## 2015-04-20 LAB — PROTIME-INR
INR: 3.8 — AB (ref 2.00–3.50)
PROTIME: 45.6 s — AB (ref 10.6–13.4)

## 2015-04-20 LAB — POCT INR: INR: 3.8

## 2015-04-20 NOTE — Patient Instructions (Signed)
Continue Coumadin 6mg  daily.  Recheck INR in 9 weeks on 06/26/15 with your next scheduled appmt with Dr. Julien Nordmann

## 2015-04-20 NOTE — Progress Notes (Signed)
Pt seen in clinic today INR=3.6 on 6mg  daily No changes to report He has been on this dose for well over 3 years INR goal 2.5-3.5 No missed or extra doses I informed patient this was the last time we would see him in the coumadin clinic His next appmt with MD is 06/26/15 Dose remains at 6mg  daily Instructed patient to call us with any problems or concerns leading up to his 11/7/appmt.

## 2015-05-01 ENCOUNTER — Other Ambulatory Visit: Payer: Self-pay | Admitting: Internal Medicine

## 2015-05-28 ENCOUNTER — Other Ambulatory Visit: Payer: Self-pay | Admitting: Internal Medicine

## 2015-06-21 ENCOUNTER — Other Ambulatory Visit: Payer: Self-pay | Admitting: Pharmacist

## 2015-06-21 DIAGNOSIS — Z7901 Long term (current) use of anticoagulants: Secondary | ICD-10-CM

## 2015-06-26 ENCOUNTER — Ambulatory Visit (HOSPITAL_BASED_OUTPATIENT_CLINIC_OR_DEPARTMENT_OTHER): Payer: Medicare HMO | Admitting: Internal Medicine

## 2015-06-26 ENCOUNTER — Other Ambulatory Visit (HOSPITAL_BASED_OUTPATIENT_CLINIC_OR_DEPARTMENT_OTHER): Payer: Medicare HMO

## 2015-06-26 ENCOUNTER — Encounter: Payer: Self-pay | Admitting: Internal Medicine

## 2015-06-26 ENCOUNTER — Telehealth: Payer: Self-pay | Admitting: Internal Medicine

## 2015-06-26 VITALS — BP 145/70 | HR 77 | Temp 97.6°F | Resp 18 | Ht 71.0 in | Wt 180.7 lb

## 2015-06-26 DIAGNOSIS — Z86711 Personal history of pulmonary embolism: Secondary | ICD-10-CM

## 2015-06-26 DIAGNOSIS — Z86718 Personal history of other venous thrombosis and embolism: Secondary | ICD-10-CM | POA: Diagnosis not present

## 2015-06-26 DIAGNOSIS — Z7901 Long term (current) use of anticoagulants: Secondary | ICD-10-CM

## 2015-06-26 DIAGNOSIS — I2699 Other pulmonary embolism without acute cor pulmonale: Secondary | ICD-10-CM

## 2015-06-26 DIAGNOSIS — I82402 Acute embolism and thrombosis of unspecified deep veins of left lower extremity: Secondary | ICD-10-CM

## 2015-06-26 DIAGNOSIS — I2609 Other pulmonary embolism with acute cor pulmonale: Secondary | ICD-10-CM

## 2015-06-26 LAB — CBC WITH DIFFERENTIAL/PLATELET
BASO%: 1 % (ref 0.0–2.0)
BASOS ABS: 0 10*3/uL (ref 0.0–0.1)
EOS%: 3.1 % (ref 0.0–7.0)
Eosinophils Absolute: 0.1 10*3/uL (ref 0.0–0.5)
HEMATOCRIT: 43.5 % (ref 38.4–49.9)
HEMOGLOBIN: 15.1 g/dL (ref 13.0–17.1)
LYMPH#: 1.5 10*3/uL (ref 0.9–3.3)
LYMPH%: 37.2 % (ref 14.0–49.0)
MCH: 31.7 pg (ref 27.2–33.4)
MCHC: 34.7 g/dL (ref 32.0–36.0)
MCV: 91.2 fL (ref 79.3–98.0)
MONO#: 0.3 10*3/uL (ref 0.1–0.9)
MONO%: 7.9 % (ref 0.0–14.0)
NEUT#: 2 10*3/uL (ref 1.5–6.5)
NEUT%: 50.8 % (ref 39.0–75.0)
Platelets: 227 10*3/uL (ref 140–400)
RBC: 4.77 10*6/uL (ref 4.20–5.82)
RDW: 12.2 % (ref 11.0–14.6)
WBC: 3.9 10*3/uL — ABNORMAL LOW (ref 4.0–10.3)
nRBC: 0 % (ref 0–0)

## 2015-06-26 LAB — PROTIME-INR
INR: 3.8 — AB (ref 2.00–3.50)
Protime: 45.6 Seconds — ABNORMAL HIGH (ref 10.6–13.4)

## 2015-06-26 NOTE — Telephone Encounter (Signed)
Gave and printed appts ched and avs for pt for DEC  °

## 2015-06-26 NOTE — Progress Notes (Signed)
[place] [phone]   [phone]) [phone]  OFFICE PROGRESS NOTE  No PCP Per Patient No address on file  DIAGNOSIS: History of deep venous thrombosis and pulmonary embolism diagnosed in 2006.  PRIOR THERAPY: None  CURRENT THERAPY: Coumadin 6 mg alternating with 4 mg by mouth every other day.  INTERVAL HISTORY: Travis Morgan 64 y.o. male returns to the clinic today for six-month follow-up visit. The patient mentions that he was diagnosed deep venous thrombosis and pulmonary embolism in 2006 of unclear underlying cause. Hypercoagulable workup performed at that time was unremarkable. The patient was started on anticoagulation with Coumadin and has been on this treatment for the last 10 years. He is currently on 6 mg by mouth daily of Coumadin and tolerating it well. He denied having any bleeding issues. He has no chest pain, shortness of breath, cough or hemoptysis. He continues to have some soreness on the right calf.  MEDICAL HISTORY:History reviewed. No pertinent past medical history.  ALLERGIES:  has No Known Allergies.  MEDICATIONS:  Current Outpatient Prescriptions  Medication Sig Dispense Refill  . acetaminophen (TYLENOL) 500 MG tablet Take 500 mg by mouth every 6 (six) hours as needed.    Marland Kitchen lisinopril-hydrochlorothiazide (PRINZIDE,ZESTORETIC) 20-12.5 MG per tablet Take 1 tablet by mouth daily.    Marland Kitchen warfarin (COUMADIN) 2 MG tablet TAKE THREE TABLETS BY MOUTH ONCE DAILY 90 tablet 0   No current facility-administered medications for this visit.    SURGICAL HISTORY: History reviewed. No pertinent past surgical history.  REVIEW OF SYSTEMS:  A comprehensive review of systems was negative except for: Musculoskeletal: positive for muscle weakness   PHYSICAL EXAMINATION: General appearance: alert, cooperative and no distress Head: Normocephalic, without obvious abnormality, atraumatic Neck: no adenopathy, no JVD, supple, symmetrical, trachea  midline and thyroid not enlarged, symmetric, no tenderness/mass/nodules Lymph nodes: Cervical, supraclavicular, and axillary nodes normal. Resp: clear to auscultation bilaterally Back: symmetric, no curvature. ROM normal. No CVA tenderness. Cardio: regular rate and rhythm, S1, S2 normal, no murmur, click, rub or gallop GI: soft, non-tender; bowel sounds normal; no masses,  no organomegaly Extremities: extremities normal, atraumatic, no cyanosis or edema  ECOG PERFORMANCE STATUS: 1 - Symptomatic but completely ambulatory  Blood pressure 145/70, pulse 77, temperature 97.6 F (36.4 C), temperature source Oral, resp. rate 18, height 5\' 11"  (1.803 m), weight 180 lb 11.2 oz (81.965 kg), SpO2 99 %.  LABORATORY DATA: Lab Results  Component Value Date   WBC 3.9* 06/26/2015   HGB 15.1 06/26/2015   HCT 43.5 06/26/2015   MCV 91.2 06/26/2015   PLT 227 06/26/2015      Chemistry      Component Value Date/Time   NA 141 12/26/2014 0829   NA 138 10/21/2011 1354   K 4.1 12/26/2014 0829   K 4.3 10/21/2011 1354   CL 103 12/21/2012 1529   CL 107 10/21/2011 1354   CO2 25 12/26/2014 0829   CO2 23 10/21/2011 1354   BUN 12.3 12/26/2014 0829   BUN 14 10/21/2011 1354   CREATININE 1.2 12/26/2014 0829   CREATININE 1.21 10/21/2011 1354      Component Value Date/Time   CALCIUM 9.1 12/26/2014 0829   CALCIUM 9.2 10/21/2011 1354   ALKPHOS 74 12/26/2014 0829   ALKPHOS 73 10/21/2011 1354   AST 19 12/26/2014 0829   AST 18 10/21/2011 1354   ALT 24 12/26/2014 0829   ALT 16 10/21/2011 1354   BILITOT 0.47 12/26/2014 0829  BILITOT 0.5 10/21/2011 1354       RADIOGRAPHIC STUDIES: No results found.  ASSESSMENT AND PLAN: This is a very pleasant 64 years old African-American male with history of deep venous thrombosis and pulmonary embolism of unclear underlying cause diagnosed in 2006 and has been on anticoagulation with Coumadin since that time. His hypercoagulable workup at that time was  unremarkable. He is currently on Coumadin 6 mg by mouth daily and tolerating his treatment well but his INR today is 3.8. I recommended for the patient to change the Coumadin dose to 6 mg alternating with 4 mg every other day. I will see him back for follow-up visit in one month's for reevaluation and repeat CBC, PT/INR. He was advised to call if he has any concerning symptoms in the interval. The patient voices understanding of current disease status and treatment options and is in agreement with the current care plan.  All questions were answered. The patient knows to call the clinic with any problems, questions or concerns. We can certainly see the patient much sooner if necessary.  Disclaimer: This note was dictated with voice recognition software. Similar sounding words can inadvertently be transcribed and may not be corrected upon review.

## 2015-06-27 ENCOUNTER — Other Ambulatory Visit: Payer: Self-pay | Admitting: Internal Medicine

## 2015-07-24 ENCOUNTER — Other Ambulatory Visit (HOSPITAL_BASED_OUTPATIENT_CLINIC_OR_DEPARTMENT_OTHER): Payer: Medicare HMO

## 2015-07-24 ENCOUNTER — Telehealth: Payer: Self-pay | Admitting: Internal Medicine

## 2015-07-24 ENCOUNTER — Encounter: Payer: Self-pay | Admitting: Internal Medicine

## 2015-07-24 ENCOUNTER — Ambulatory Visit (HOSPITAL_BASED_OUTPATIENT_CLINIC_OR_DEPARTMENT_OTHER): Payer: Medicare HMO | Admitting: Internal Medicine

## 2015-07-24 VITALS — BP 138/116 | HR 80 | Temp 98.2°F | Resp 18 | Ht 71.0 in | Wt 184.5 lb

## 2015-07-24 DIAGNOSIS — I82592 Chronic embolism and thrombosis of other specified deep vein of left lower extremity: Secondary | ICD-10-CM

## 2015-07-24 DIAGNOSIS — I2782 Chronic pulmonary embolism: Secondary | ICD-10-CM

## 2015-07-24 DIAGNOSIS — Z86711 Personal history of pulmonary embolism: Secondary | ICD-10-CM

## 2015-07-24 DIAGNOSIS — Z86718 Personal history of other venous thrombosis and embolism: Secondary | ICD-10-CM

## 2015-07-24 DIAGNOSIS — Z7901 Long term (current) use of anticoagulants: Secondary | ICD-10-CM

## 2015-07-24 DIAGNOSIS — I82402 Acute embolism and thrombosis of unspecified deep veins of left lower extremity: Secondary | ICD-10-CM

## 2015-07-24 DIAGNOSIS — I2609 Other pulmonary embolism with acute cor pulmonale: Secondary | ICD-10-CM

## 2015-07-24 LAB — PROTIME-INR
INR: 2.4 (ref 2.00–3.50)
Protime: 28.8 Seconds — ABNORMAL HIGH (ref 10.6–13.4)

## 2015-07-24 LAB — CBC WITH DIFFERENTIAL/PLATELET
BASO%: 0.7 % (ref 0.0–2.0)
Basophils Absolute: 0 10*3/uL (ref 0.0–0.1)
EOS ABS: 0.2 10*3/uL (ref 0.0–0.5)
EOS%: 4.2 % (ref 0.0–7.0)
HEMATOCRIT: 43.5 % (ref 38.4–49.9)
HEMOGLOBIN: 14.9 g/dL (ref 13.0–17.1)
LYMPH#: 1.2 10*3/uL (ref 0.9–3.3)
LYMPH%: 30 % (ref 14.0–49.0)
MCH: 31.6 pg (ref 27.2–33.4)
MCHC: 34.3 g/dL (ref 32.0–36.0)
MCV: 92.4 fL (ref 79.3–98.0)
MONO#: 0.3 10*3/uL (ref 0.1–0.9)
MONO%: 7.2 % (ref 0.0–14.0)
NEUT%: 57.9 % (ref 39.0–75.0)
NEUTROS ABS: 2.3 10*3/uL (ref 1.5–6.5)
NRBC: 0 % (ref 0–0)
PLATELETS: 201 10*3/uL (ref 140–400)
RBC: 4.71 10*6/uL (ref 4.20–5.82)
RDW: 12.1 % (ref 11.0–14.6)
WBC: 4 10*3/uL (ref 4.0–10.3)

## 2015-07-24 NOTE — Telephone Encounter (Signed)
Gave patient avs report and appointments for January thru March.  °

## 2015-07-24 NOTE — Progress Notes (Signed)
[place] Telephone:([phone]   Fax:([phone]  OFFICE PROGRESS NOTE  No PCP Per Patient No address on file  DIAGNOSIS: History of deep venous thrombosis and pulmonary embolism diagnosed in 2006.  PRIOR THERAPY: None.  CURRENT THERAPY: Coumadin 6 mg alternating with 4 mg by mouth every other day.  INTERVAL HISTORY: Travis Morgan 64 y.o. male returns to the clinic today for 1 month follow-up visit. The patient mentions that he was diagnosed deep venous thrombosis and pulmonary embolism in 2006 of unclear underlying cause. Hypercoagulable workup performed at that time was unremarkable. The patient was started on anticoagulation with Coumadin and has been on this treatment for the last 10 years. He is currently on Coumadin 6 mg alternating by 4 mg by mouth daily of Coumadin and tolerating it well. He denied having any bleeding issues. He has no chest pain, shortness of breath, cough or hemoptysis. He continues to have some soreness on the right calf. He has repeat CBC and iron are performed earlier today and he is here for evaluation and discussion of his lab results and adjustment of his Coumadin dose. His INR today is 2.4.  MEDICAL HISTORY:History reviewed. No pertinent past medical history.  ALLERGIES:  has No Known Allergies.  MEDICATIONS:  Current Outpatient Prescriptions  Medication Sig Dispense Refill  . acetaminophen (TYLENOL) 500 MG tablet Take 500 mg by mouth every 6 (six) hours as needed.    Marland Kitchen lisinopril-hydrochlorothiazide (PRINZIDE,ZESTORETIC) 20-12.5 MG per tablet Take 1 tablet by mouth daily.    Marland Kitchen warfarin (COUMADIN) 2 MG tablet TAKE THREE TABLETS BY MOUTH ONCE DAILY 90 tablet 0   No current facility-administered medications for this visit.    SURGICAL HISTORY: History reviewed. No pertinent past surgical history.  REVIEW OF SYSTEMS:  A comprehensive review of systems was negative except for: Musculoskeletal: positive for muscle  weakness   PHYSICAL EXAMINATION: General appearance: alert, cooperative and no distress Head: Normocephalic, without obvious abnormality, atraumatic Neck: no adenopathy, no JVD, supple, symmetrical, trachea midline and thyroid not enlarged, symmetric, no tenderness/mass/nodules Lymph nodes: Cervical, supraclavicular, and axillary nodes normal. Resp: clear to auscultation bilaterally Back: symmetric, no curvature. ROM normal. No CVA tenderness. Cardio: regular rate and rhythm, S1, S2 normal, no murmur, click, rub or gallop GI: soft, non-tender; bowel sounds normal; no masses,  no organomegaly Extremities: extremities normal, atraumatic, no cyanosis or edema  ECOG PERFORMANCE STATUS: 1 - Symptomatic but completely ambulatory  Blood pressure 138/116, pulse 80, temperature 98.2 F (36.8 C), temperature source Oral, resp. rate 18, height 5\' 11"  (1.803 m), weight 184 lb 8 oz (83.689 kg), SpO2 100 %.  LABORATORY DATA: Lab Results  Component Value Date   WBC 4.0 07/24/2015   HGB 14.9 07/24/2015   HCT 43.5 07/24/2015   MCV 92.4 07/24/2015   PLT 201 07/24/2015      Chemistry      Component Value Date/Time   NA 141 12/26/2014 0829   NA 138 10/21/2011 1354   K 4.1 12/26/2014 0829   K 4.3 10/21/2011 1354   CL 103 12/21/2012 1529   CL 107 10/21/2011 1354   CO2 25 12/26/2014 0829   CO2 23 10/21/2011 1354   BUN 12.3 12/26/2014 0829   BUN 14 10/21/2011 1354   CREATININE 1.2 12/26/2014 0829   CREATININE 1.21 10/21/2011 1354      Component Value Date/Time   CALCIUM 9.1 12/26/2014 0829   CALCIUM 9.2 10/21/2011 1354   ALKPHOS 74  12/26/2014 0829   ALKPHOS 73 10/21/2011 1354   AST 19 12/26/2014 0829   AST 18 10/21/2011 1354   ALT 24 12/26/2014 0829   ALT 16 10/21/2011 1354   BILITOT 0.47 12/26/2014 0829   BILITOT 0.5 10/21/2011 1354       RADIOGRAPHIC STUDIES: No results found.  ASSESSMENT AND PLAN: This is a very pleasant 64 years old African-American male with history of deep  venous thrombosis and pulmonary embolism of unclear underlying cause diagnosed in 2006 and has been on anticoagulation with Coumadin since that time. His hypercoagulable workup at that time was unremarkable. He is currently on Coumadin 6 mg by mouth daily and tolerating his treatment well but his INR today is 2.4. I recommended for the patient to change the Coumadin dose to 6 mg alternating with 4 mg every other day. I will see him back for follow-up visit in 3 months for reevaluation but he will continue to have repeat CBC, PT/INR on monthly basis. He was advised to call if he has any concerning symptoms in the interval. The patient voices understanding of current disease status and treatment options and is in agreement with the current care plan.  All questions were answered. The patient knows to call the clinic with any problems, questions or concerns. We can certainly see the patient much sooner if necessary.  Disclaimer: This note was dictated with voice recognition software. Similar sounding words can inadvertently be transcribed and may not be corrected upon review.

## 2015-07-30 ENCOUNTER — Other Ambulatory Visit: Payer: Self-pay | Admitting: Internal Medicine

## 2015-08-23 ENCOUNTER — Other Ambulatory Visit (HOSPITAL_BASED_OUTPATIENT_CLINIC_OR_DEPARTMENT_OTHER): Payer: Medicare HMO

## 2015-08-23 DIAGNOSIS — Z86711 Personal history of pulmonary embolism: Secondary | ICD-10-CM | POA: Diagnosis not present

## 2015-08-23 DIAGNOSIS — I2782 Chronic pulmonary embolism: Secondary | ICD-10-CM

## 2015-08-23 DIAGNOSIS — I82592 Chronic embolism and thrombosis of other specified deep vein of left lower extremity: Secondary | ICD-10-CM

## 2015-08-23 DIAGNOSIS — Z86718 Personal history of other venous thrombosis and embolism: Secondary | ICD-10-CM | POA: Diagnosis not present

## 2015-08-23 LAB — CBC WITH DIFFERENTIAL/PLATELET
BASO%: 1 % (ref 0.0–2.0)
Basophils Absolute: 0 10*3/uL (ref 0.0–0.1)
EOS%: 3.8 % (ref 0.0–7.0)
Eosinophils Absolute: 0.2 10*3/uL (ref 0.0–0.5)
HCT: 46 % (ref 38.4–49.9)
HGB: 16 g/dL (ref 13.0–17.1)
LYMPH%: 32.8 % (ref 14.0–49.0)
MCH: 31.5 pg (ref 27.2–33.4)
MCHC: 34.8 g/dL (ref 32.0–36.0)
MCV: 90.6 fL (ref 79.3–98.0)
MONO#: 0.3 10*3/uL (ref 0.1–0.9)
MONO%: 6.3 % (ref 0.0–14.0)
NEUT#: 2.2 10*3/uL (ref 1.5–6.5)
NEUT%: 56.1 % (ref 39.0–75.0)
Platelets: 187 10*3/uL (ref 140–400)
RBC: 5.08 10*6/uL (ref 4.20–5.82)
RDW: 11.9 % (ref 11.0–14.6)
WBC: 4 10*3/uL (ref 4.0–10.3)
lymph#: 1.3 10*3/uL (ref 0.9–3.3)
nRBC: 0 % (ref 0–0)

## 2015-08-23 LAB — PROTIME-INR
INR: 1.9 — ABNORMAL LOW (ref 2.00–3.50)
PROTIME: 22.8 s — AB (ref 10.6–13.4)

## 2015-08-24 ENCOUNTER — Telehealth: Payer: Self-pay | Admitting: Medical Oncology

## 2015-08-24 NOTE — Telephone Encounter (Signed)
Per Travis Morgan, I told pt to stay on his same dose of coumadin-6 mg/day

## 2015-09-07 ENCOUNTER — Other Ambulatory Visit: Payer: Self-pay | Admitting: Internal Medicine

## 2015-09-20 ENCOUNTER — Other Ambulatory Visit (HOSPITAL_BASED_OUTPATIENT_CLINIC_OR_DEPARTMENT_OTHER): Payer: Medicare HMO

## 2015-09-20 DIAGNOSIS — I82592 Chronic embolism and thrombosis of other specified deep vein of left lower extremity: Secondary | ICD-10-CM | POA: Diagnosis not present

## 2015-09-20 DIAGNOSIS — I2782 Chronic pulmonary embolism: Secondary | ICD-10-CM

## 2015-09-20 LAB — CBC WITH DIFFERENTIAL/PLATELET
BASO%: 0.5 % (ref 0.0–2.0)
Basophils Absolute: 0 10*3/uL (ref 0.0–0.1)
EOS ABS: 0.2 10*3/uL (ref 0.0–0.5)
EOS%: 3.9 % (ref 0.0–7.0)
HEMATOCRIT: 45.2 % (ref 38.4–49.9)
HEMOGLOBIN: 15.5 g/dL (ref 13.0–17.1)
LYMPH#: 1.3 10*3/uL (ref 0.9–3.3)
LYMPH%: 33.9 % (ref 14.0–49.0)
MCH: 31.1 pg (ref 27.2–33.4)
MCHC: 34.3 g/dL (ref 32.0–36.0)
MCV: 90.6 fL (ref 79.3–98.0)
MONO#: 0.3 10*3/uL (ref 0.1–0.9)
MONO%: 6.8 % (ref 0.0–14.0)
NEUT%: 54.9 % (ref 39.0–75.0)
NEUTROS ABS: 2.1 10*3/uL (ref 1.5–6.5)
NRBC: 0 % (ref 0–0)
PLATELETS: 193 10*3/uL (ref 140–400)
RBC: 4.99 10*6/uL (ref 4.20–5.82)
RDW: 11.8 % (ref 11.0–14.6)
WBC: 3.8 10*3/uL — AB (ref 4.0–10.3)

## 2015-09-20 LAB — PROTIME-INR
INR: 1.8 — ABNORMAL LOW (ref 2.00–3.50)
Protime: 21.6 Seconds — ABNORMAL HIGH (ref 10.6–13.4)

## 2015-09-25 ENCOUNTER — Ambulatory Visit: Payer: Medicare HMO | Admitting: Internal Medicine

## 2015-09-25 ENCOUNTER — Other Ambulatory Visit: Payer: Medicare HMO

## 2015-10-14 ENCOUNTER — Other Ambulatory Visit: Payer: Self-pay | Admitting: Internal Medicine

## 2015-10-18 ENCOUNTER — Telehealth: Payer: Self-pay | Admitting: Internal Medicine

## 2015-10-18 ENCOUNTER — Encounter: Payer: Self-pay | Admitting: Internal Medicine

## 2015-10-18 ENCOUNTER — Ambulatory Visit (HOSPITAL_BASED_OUTPATIENT_CLINIC_OR_DEPARTMENT_OTHER): Payer: Medicare HMO | Admitting: Internal Medicine

## 2015-10-18 ENCOUNTER — Other Ambulatory Visit (HOSPITAL_BASED_OUTPATIENT_CLINIC_OR_DEPARTMENT_OTHER): Payer: Medicare HMO

## 2015-10-18 VITALS — BP 138/78 | HR 66 | Temp 97.9°F | Resp 18 | Ht 71.0 in | Wt 179.1 lb

## 2015-10-18 DIAGNOSIS — I2782 Chronic pulmonary embolism: Secondary | ICD-10-CM

## 2015-10-18 DIAGNOSIS — Z86718 Personal history of other venous thrombosis and embolism: Secondary | ICD-10-CM

## 2015-10-18 DIAGNOSIS — I2699 Other pulmonary embolism without acute cor pulmonale: Secondary | ICD-10-CM

## 2015-10-18 DIAGNOSIS — Z86711 Personal history of pulmonary embolism: Secondary | ICD-10-CM | POA: Diagnosis not present

## 2015-10-18 DIAGNOSIS — I82502 Chronic embolism and thrombosis of unspecified deep veins of left lower extremity: Secondary | ICD-10-CM

## 2015-10-18 DIAGNOSIS — Z7901 Long term (current) use of anticoagulants: Secondary | ICD-10-CM | POA: Diagnosis not present

## 2015-10-18 DIAGNOSIS — I82592 Chronic embolism and thrombosis of other specified deep vein of left lower extremity: Secondary | ICD-10-CM

## 2015-10-18 LAB — CBC WITH DIFFERENTIAL/PLATELET
BASO%: 0.9 % (ref 0.0–2.0)
Basophils Absolute: 0 10*3/uL (ref 0.0–0.1)
EOS ABS: 0.2 10*3/uL (ref 0.0–0.5)
EOS%: 3.5 % (ref 0.0–7.0)
HCT: 42.9 % (ref 38.4–49.9)
HGB: 14.9 g/dL (ref 13.0–17.1)
LYMPH%: 34.8 % (ref 14.0–49.0)
MCH: 31.1 pg (ref 27.2–33.4)
MCHC: 34.7 g/dL (ref 32.0–36.0)
MCV: 89.6 fL (ref 79.3–98.0)
MONO#: 0.3 10*3/uL (ref 0.1–0.9)
MONO%: 6.7 % (ref 0.0–14.0)
NEUT%: 54.1 % (ref 39.0–75.0)
NEUTROS ABS: 2.4 10*3/uL (ref 1.5–6.5)
NRBC: 0 % (ref 0–0)
Platelets: 190 10*3/uL (ref 140–400)
RBC: 4.79 10*6/uL (ref 4.20–5.82)
RDW: 11.6 % (ref 11.0–14.6)
WBC: 4.3 10*3/uL (ref 4.0–10.3)
lymph#: 1.5 10*3/uL (ref 0.9–3.3)

## 2015-10-18 LAB — PROTIME-INR
INR: 1.3 — ABNORMAL LOW (ref 2.00–3.50)
PROTIME: 15.6 s — AB (ref 10.6–13.4)

## 2015-10-18 NOTE — Telephone Encounter (Signed)
cld & spoke to pt and adv pt of time & dte of appt

## 2015-10-18 NOTE — Progress Notes (Signed)
[place] Telephone:([phone]   Fax:([phone]  OFFICE PROGRESS NOTE  No PCP Per Patient No address on file  DIAGNOSIS: History of deep venous thrombosis and pulmonary embolism diagnosed in 2006.  PRIOR THERAPY: None.  CURRENT THERAPY: Coumadin 6 mg by mouth daily  INTERVAL HISTORY: Travis Morgan 65 y.o. male returns to the clinic today for 3 month follow-up visit for evaluation of his history of DVT and venous thrombosis and pulmonary embolism. Hypercoagulable workup performed at that time was unremarkable. The patient has been on anticoagulation with Coumadin for the last 10 years. He is currently on Coumadin 6 mg alternating by 4 mg by mouth daily of Coumadin and tolerating it well. He denied having any bleeding issues. He has no chest pain, shortness of breath, cough or hemoptysis. He continues to have some soreness on the right calf. He has repeat CBC and iron are performed earlier today and he is here for evaluation and discussion of his lab results and adjustment of his Coumadin dose. His INR today is subtherapeutic at 1.3. He has been eating a lot of green vegetables recently. He continues to have pain on the right calf with weakness of his lower extremity.  MEDICAL HISTORY:No past medical history on file.  ALLERGIES:  has No Known Allergies.  MEDICATIONS:  Current Outpatient Prescriptions  Medication Sig Dispense Refill  . acetaminophen (TYLENOL) 500 MG tablet Take 500 mg by mouth every 6 (six) hours as needed.    Marland Kitchen lisinopril-hydrochlorothiazide (PRINZIDE,ZESTORETIC) 20-12.5 MG per tablet Take 1 tablet by mouth daily.    Marland Kitchen warfarin (COUMADIN) 2 MG tablet TAKE THREE TABLETS BY MOUTH ONCE DAILY 90 tablet 0   No current facility-administered medications for this visit.    SURGICAL HISTORY: No past surgical history on file.  REVIEW OF SYSTEMS:  A comprehensive review of systems was negative except for: Musculoskeletal: positive for muscle  weakness   PHYSICAL EXAMINATION: General appearance: alert, cooperative and no distress Head: Normocephalic, without obvious abnormality, atraumatic Neck: no adenopathy, no JVD, supple, symmetrical, trachea midline and thyroid not enlarged, symmetric, no tenderness/mass/nodules Lymph nodes: Cervical, supraclavicular, and axillary nodes normal. Resp: clear to auscultation bilaterally Back: symmetric, no curvature. ROM normal. No CVA tenderness. Cardio: regular rate and rhythm, S1, S2 normal, no murmur, click, rub or gallop GI: soft, non-tender; bowel sounds normal; no masses,  no organomegaly Extremities: extremities normal, atraumatic, no cyanosis or edema  ECOG PERFORMANCE STATUS: 1 - Symptomatic but completely ambulatory  Blood pressure 138/78, pulse 66, temperature 97.9 F (36.6 C), temperature source Oral, resp. rate 18, height 5\' 11"  (1.803 m), weight 179 lb 1.6 oz (81.239 kg), SpO2 100 %.  LABORATORY DATA: Lab Results  Component Value Date   WBC 4.3 10/18/2015   HGB 14.9 10/18/2015   HCT 42.9 10/18/2015   MCV 89.6 10/18/2015   PLT 190 10/18/2015      Chemistry      Component Value Date/Time   NA 141 12/26/2014 0829   NA 138 10/21/2011 1354   K 4.1 12/26/2014 0829   K 4.3 10/21/2011 1354   CL 103 12/21/2012 1529   CL 107 10/21/2011 1354   CO2 25 12/26/2014 0829   CO2 23 10/21/2011 1354   BUN 12.3 12/26/2014 0829   BUN 14 10/21/2011 1354   CREATININE 1.2 12/26/2014 0829   CREATININE 1.21 10/21/2011 1354      Component Value Date/Time   CALCIUM 9.1 12/26/2014 0829   CALCIUM  9.2 10/21/2011 1354   ALKPHOS 74 12/26/2014 0829   ALKPHOS 73 10/21/2011 1354   AST 19 12/26/2014 0829   AST 18 10/21/2011 1354   ALT 24 12/26/2014 0829   ALT 16 10/21/2011 1354   BILITOT 0.47 12/26/2014 0829   BILITOT 0.5 10/21/2011 1354       RADIOGRAPHIC STUDIES: No results found.  ASSESSMENT AND PLAN: This is a very pleasant 65 years old African-American male with history of deep  venous thrombosis and pulmonary embolism of unclear underlying cause diagnosed in 2006 and has been on anticoagulation with Coumadin since that time. His hypercoagulable workup at that time was unremarkable. He is currently on Coumadin dose 6 mg alternating with 4 mg every other day. His PT/INR was subtherapeutic. I recommended for the patient to increase his dose of Coumadin to 6 mg by mouth daily. I also strongly advise him to decrease the amount of green vegetables in his diet. I will see him back for follow-up visit in 2 months for reevaluation but he will continue to have repeat CBC, PT/INR on monthly basis. He was advised to call if he has any concerning symptoms in the interval. The patient voices understanding of current disease status and treatment options and is in agreement with the current care plan.  All questions were answered. The patient knows to call the clinic with any problems, questions or concerns. We can certainly see the patient much sooner if necessary.  Disclaimer: This note was dictated with voice recognition software. Similar sounding words can inadvertently be transcribed and may not be corrected upon review.

## 2015-11-15 ENCOUNTER — Telehealth: Payer: Self-pay | Admitting: Medical Oncology

## 2015-11-15 ENCOUNTER — Telehealth: Payer: Self-pay | Admitting: Internal Medicine

## 2015-11-15 ENCOUNTER — Other Ambulatory Visit (HOSPITAL_BASED_OUTPATIENT_CLINIC_OR_DEPARTMENT_OTHER): Payer: Medicare HMO

## 2015-11-15 DIAGNOSIS — I82502 Chronic embolism and thrombosis of unspecified deep veins of left lower extremity: Secondary | ICD-10-CM | POA: Diagnosis not present

## 2015-11-15 DIAGNOSIS — I2699 Other pulmonary embolism without acute cor pulmonale: Secondary | ICD-10-CM | POA: Diagnosis not present

## 2015-11-15 LAB — CBC WITH DIFFERENTIAL/PLATELET
BASO%: 0.5 % (ref 0.0–2.0)
BASOS ABS: 0 10*3/uL (ref 0.0–0.1)
EOS%: 2.6 % (ref 0.0–7.0)
Eosinophils Absolute: 0.1 10*3/uL (ref 0.0–0.5)
HEMATOCRIT: 39.7 % (ref 38.4–49.9)
HEMOGLOBIN: 13.6 g/dL (ref 13.0–17.1)
LYMPH#: 1.2 10*3/uL (ref 0.9–3.3)
LYMPH%: 31.4 % (ref 14.0–49.0)
MCH: 30.8 pg (ref 27.2–33.4)
MCHC: 34.3 g/dL (ref 32.0–36.0)
MCV: 89.8 fL (ref 79.3–98.0)
MONO#: 0.4 10*3/uL (ref 0.1–0.9)
MONO%: 9.7 % (ref 0.0–14.0)
NEUT#: 2.1 10*3/uL (ref 1.5–6.5)
NEUT%: 55.8 % (ref 39.0–75.0)
PLATELETS: 174 10*3/uL (ref 140–400)
RBC: 4.42 10*6/uL (ref 4.20–5.82)
RDW: 11.8 % (ref 11.0–14.6)
WBC: 3.8 10*3/uL — ABNORMAL LOW (ref 4.0–10.3)
nRBC: 0 % (ref 0–0)

## 2015-11-15 LAB — PROTIME-INR
INR: 3 (ref 2.00–3.50)
Protime: 36 Seconds — ABNORMAL HIGH (ref 10.6–13.4)

## 2015-11-15 NOTE — Telephone Encounter (Signed)
I left a message for pt to stay on same coumadin dose ( we have 6 mg /day ) I told him to call back if dose is different.

## 2015-11-15 NOTE — Telephone Encounter (Signed)
Patient stopped by today after lab to get copy of schedule for April/May.

## 2015-11-19 ENCOUNTER — Other Ambulatory Visit: Payer: Self-pay | Admitting: Internal Medicine

## 2015-12-13 ENCOUNTER — Other Ambulatory Visit (HOSPITAL_BASED_OUTPATIENT_CLINIC_OR_DEPARTMENT_OTHER): Payer: Medicare HMO

## 2015-12-13 DIAGNOSIS — I2699 Other pulmonary embolism without acute cor pulmonale: Secondary | ICD-10-CM

## 2015-12-13 DIAGNOSIS — I82502 Chronic embolism and thrombosis of unspecified deep veins of left lower extremity: Secondary | ICD-10-CM

## 2015-12-13 LAB — CBC WITH DIFFERENTIAL/PLATELET
BASO%: 0.8 % (ref 0.0–2.0)
Basophils Absolute: 0 10*3/uL (ref 0.0–0.1)
EOS%: 4.4 % (ref 0.0–7.0)
Eosinophils Absolute: 0.2 10*3/uL (ref 0.0–0.5)
HEMATOCRIT: 44 % (ref 38.4–49.9)
HEMOGLOBIN: 14.8 g/dL (ref 13.0–17.1)
LYMPH#: 1.4 10*3/uL (ref 0.9–3.3)
LYMPH%: 38.1 % (ref 14.0–49.0)
MCH: 30.3 pg (ref 27.2–33.4)
MCHC: 33.6 g/dL (ref 32.0–36.0)
MCV: 90 fL (ref 79.3–98.0)
MONO#: 0.3 10*3/uL (ref 0.1–0.9)
MONO%: 8 % (ref 0.0–14.0)
NEUT%: 48.7 % (ref 39.0–75.0)
NEUTROS ABS: 1.8 10*3/uL (ref 1.5–6.5)
Platelets: 186 10*3/uL (ref 140–400)
RBC: 4.89 10*6/uL (ref 4.20–5.82)
RDW: 12.2 % (ref 11.0–14.6)
WBC: 3.6 10*3/uL — AB (ref 4.0–10.3)
nRBC: 0 % (ref 0–0)

## 2015-12-13 LAB — PROTIME-INR
INR: 3.1 (ref 2.00–3.50)
PROTIME: 37.2 s — AB (ref 10.6–13.4)

## 2015-12-21 ENCOUNTER — Other Ambulatory Visit: Payer: Self-pay | Admitting: Internal Medicine

## 2016-01-10 ENCOUNTER — Encounter: Payer: Self-pay | Admitting: Internal Medicine

## 2016-01-10 ENCOUNTER — Other Ambulatory Visit (HOSPITAL_BASED_OUTPATIENT_CLINIC_OR_DEPARTMENT_OTHER): Payer: Medicare HMO

## 2016-01-10 ENCOUNTER — Telehealth: Payer: Self-pay | Admitting: Internal Medicine

## 2016-01-10 ENCOUNTER — Ambulatory Visit (HOSPITAL_BASED_OUTPATIENT_CLINIC_OR_DEPARTMENT_OTHER): Payer: Medicare HMO | Admitting: Internal Medicine

## 2016-01-10 VITALS — BP 160/80 | HR 64 | Temp 98.2°F | Resp 18 | Ht 71.0 in | Wt 181.4 lb

## 2016-01-10 DIAGNOSIS — I2699 Other pulmonary embolism without acute cor pulmonale: Secondary | ICD-10-CM

## 2016-01-10 DIAGNOSIS — I82422 Acute embolism and thrombosis of left iliac vein: Secondary | ICD-10-CM

## 2016-01-10 DIAGNOSIS — Z86711 Personal history of pulmonary embolism: Secondary | ICD-10-CM

## 2016-01-10 DIAGNOSIS — Z7901 Long term (current) use of anticoagulants: Secondary | ICD-10-CM | POA: Diagnosis not present

## 2016-01-10 DIAGNOSIS — Z86718 Personal history of other venous thrombosis and embolism: Secondary | ICD-10-CM

## 2016-01-10 DIAGNOSIS — I82502 Chronic embolism and thrombosis of unspecified deep veins of left lower extremity: Secondary | ICD-10-CM

## 2016-01-10 LAB — CBC WITH DIFFERENTIAL/PLATELET
BASO%: 0.5 % (ref 0.0–2.0)
Basophils Absolute: 0 10*3/uL (ref 0.0–0.1)
EOS%: 9.5 % — AB (ref 0.0–7.0)
Eosinophils Absolute: 0.4 10*3/uL (ref 0.0–0.5)
HCT: 38.1 % — ABNORMAL LOW (ref 38.4–49.9)
HEMOGLOBIN: 13 g/dL (ref 13.0–17.1)
LYMPH#: 1.5 10*3/uL (ref 0.9–3.3)
LYMPH%: 38.7 % (ref 14.0–49.0)
MCH: 31 pg (ref 27.2–33.4)
MCHC: 34.1 g/dL (ref 32.0–36.0)
MCV: 90.9 fL (ref 79.3–98.0)
MONO#: 0.2 10*3/uL (ref 0.1–0.9)
MONO%: 5.3 % (ref 0.0–14.0)
NEUT#: 1.7 10*3/uL (ref 1.5–6.5)
NEUT%: 46 % (ref 39.0–75.0)
NRBC: 0 % (ref 0–0)
Platelets: 186 10*3/uL (ref 140–400)
RBC: 4.19 10*6/uL — AB (ref 4.20–5.82)
RDW: 12.1 % (ref 11.0–14.6)
WBC: 3.8 10*3/uL — AB (ref 4.0–10.3)

## 2016-01-10 LAB — PROTIME-INR
INR: 1.6 — AB (ref 2.00–3.50)
PROTIME: 19.2 s — AB (ref 10.6–13.4)

## 2016-01-10 NOTE — Progress Notes (Signed)
[place] Telephone:([phone]   Fax:(336) [phone]  OFFICE PROGRESS NOTE  No PCP Per Patient No address on file  DIAGNOSIS: History of deep venous thrombosis and pulmonary embolism diagnosed in 2006.  PRIOR THERAPY: None.  CURRENT THERAPY: Coumadin 6 mg by mouth daily except Monday and Thursday the patient will take 8 mg.  INTERVAL HISTORY: Travis Morgan 65 y.o. male returns to the clinic today for 2 month follow-up visit for evaluation of his history of DVT and venous thrombosis and pulmonary embolism. Hypercoagulable workup performed at that time was unremarkable. The patient has been on anticoagulation with Coumadin for the last 10 years. He is currently on Coumadin 6 mg by mouth daily of Coumadin and tolerating it well. He denied having any bleeding issues. He has no chest pain, shortness of breath, cough or hemoptysis. He continues to have some soreness on the right calf. He has repeat CBC and iron are performed earlier today and he is here for evaluation and discussion of his lab results and adjustment of his Coumadin dose. His INR today is subtherapeutic at 1.6. He continues to have pain on the right calf with weakness of his lower extremity.  MEDICAL HISTORY:No past medical history on file.  ALLERGIES:  has No Known Allergies.  MEDICATIONS:  Current Outpatient Prescriptions  Medication Sig Dispense Refill  . acetaminophen (TYLENOL) 500 MG tablet Take 500 mg by mouth every 6 (six) hours as needed.    Marland Kitchen lisinopril-hydrochlorothiazide (PRINZIDE,ZESTORETIC) 20-12.5 MG per tablet Take 1 tablet by mouth daily.    Marland Kitchen warfarin (COUMADIN) 2 MG tablet TAKE THREE TABLETS BY MOUTH ONCE DAILY 90 tablet 0   No current facility-administered medications for this visit.    SURGICAL HISTORY: No past surgical history on file.  REVIEW OF SYSTEMS:  A comprehensive review of systems was negative except for: Musculoskeletal: positive for muscle weakness   PHYSICAL  EXAMINATION: General appearance: alert, cooperative and no distress Head: Normocephalic, without obvious abnormality, atraumatic Neck: no adenopathy, no JVD, supple, symmetrical, trachea midline and thyroid not enlarged, symmetric, no tenderness/mass/nodules Lymph nodes: Cervical, supraclavicular, and axillary nodes normal. Resp: clear to auscultation bilaterally Back: symmetric, no curvature. ROM normal. No CVA tenderness. Cardio: regular rate and rhythm, S1, S2 normal, no murmur, click, rub or gallop GI: soft, non-tender; bowel sounds normal; no masses,  no organomegaly Extremities: extremities normal, atraumatic, no cyanosis or edema  ECOG PERFORMANCE STATUS: 1 - Symptomatic but completely ambulatory  Blood pressure 160/80, pulse 64, temperature 98.2 F (36.8 C), temperature source Oral, resp. rate 18, height 5\' 11"  (1.803 m), weight 181 lb 6.4 oz (82.283 kg), SpO2 100 %.  LABORATORY DATA: Lab Results  Component Value Date   WBC 3.8* 01/10/2016   HGB 13.0 01/10/2016   HCT 38.1* 01/10/2016   MCV 90.9 01/10/2016   PLT 186 01/10/2016      Chemistry      Component Value Date/Time   NA 141 12/26/2014 0829   NA 138 10/21/2011 1354   K 4.1 12/26/2014 0829   K 4.3 10/21/2011 1354   CL 103 12/21/2012 1529   CL 107 10/21/2011 1354   CO2 25 12/26/2014 0829   CO2 23 10/21/2011 1354   BUN 12.3 12/26/2014 0829   BUN 14 10/21/2011 1354   CREATININE 1.2 12/26/2014 0829   CREATININE 1.21 10/21/2011 1354      Component Value Date/Time   CALCIUM 9.1 12/26/2014 0829   CALCIUM 9.2 10/21/2011 1354  ALKPHOS 74 12/26/2014 0829   ALKPHOS 73 10/21/2011 1354   AST 19 12/26/2014 0829   AST 18 10/21/2011 1354   ALT 24 12/26/2014 0829   ALT 16 10/21/2011 1354   BILITOT 0.47 12/26/2014 0829   BILITOT 0.5 10/21/2011 1354       RADIOGRAPHIC STUDIES: No results found.  ASSESSMENT AND PLAN: This is a very pleasant 65 years old African-American male with history of deep venous thrombosis  and pulmonary embolism of unclear underlying cause diagnosed in 2006 and has been on anticoagulation with Coumadin since that time. His hypercoagulable workup at that time was unremarkable. He is currently on Coumadin dose 6 mg by mouth daily and tolerating it well.Marland Kitchen His PT/INR was subtherapeutic with INR of 1.6. I recommended for the patient to increase his dose of Coumadin to 6 mg by mouth daily except on Monday and Thursday she will take 8 mg.  I will see him back for follow-up visit in 2 months for reevaluation but he will continue to have repeat CBC, PT/INR on monthly basis. He was advised to call if he has any concerning symptoms in the interval. The patient voices understanding of current disease status and treatment options and is in agreement with the current care plan.  All questions were answered. The patient knows to call the clinic with any problems, questions or concerns. We can certainly see the patient much sooner if necessary.  Disclaimer: This note was dictated with voice recognition software. Similar sounding words can inadvertently be transcribed and may not be corrected upon review.

## 2016-01-10 NOTE — Telephone Encounter (Signed)
cld & spoke to wife and gave wife time & date of appt for 7/24@1 :60

## 2016-01-23 ENCOUNTER — Other Ambulatory Visit: Payer: Self-pay | Admitting: Internal Medicine

## 2016-02-25 ENCOUNTER — Other Ambulatory Visit: Payer: Self-pay | Admitting: Internal Medicine

## 2016-03-11 ENCOUNTER — Telehealth: Payer: Self-pay | Admitting: Internal Medicine

## 2016-03-11 ENCOUNTER — Other Ambulatory Visit (HOSPITAL_BASED_OUTPATIENT_CLINIC_OR_DEPARTMENT_OTHER): Payer: Medicare HMO

## 2016-03-11 ENCOUNTER — Inpatient Hospital Stay (HOSPITAL_COMMUNITY): Admission: RE | Admit: 2016-03-11 | Payer: Medicare HMO | Source: Ambulatory Visit

## 2016-03-11 ENCOUNTER — Ambulatory Visit (HOSPITAL_BASED_OUTPATIENT_CLINIC_OR_DEPARTMENT_OTHER): Payer: Medicare HMO | Admitting: Internal Medicine

## 2016-03-11 ENCOUNTER — Encounter: Payer: Self-pay | Admitting: Internal Medicine

## 2016-03-11 VITALS — BP 147/90 | HR 72 | Temp 97.8°F | Resp 18 | Ht 71.0 in | Wt 174.4 lb

## 2016-03-11 DIAGNOSIS — I2699 Other pulmonary embolism without acute cor pulmonale: Secondary | ICD-10-CM

## 2016-03-11 DIAGNOSIS — Z86711 Personal history of pulmonary embolism: Secondary | ICD-10-CM | POA: Diagnosis not present

## 2016-03-11 DIAGNOSIS — Z7901 Long term (current) use of anticoagulants: Secondary | ICD-10-CM

## 2016-03-11 DIAGNOSIS — M7989 Other specified soft tissue disorders: Secondary | ICD-10-CM

## 2016-03-11 DIAGNOSIS — Z86718 Personal history of other venous thrombosis and embolism: Secondary | ICD-10-CM

## 2016-03-11 DIAGNOSIS — I82532 Chronic embolism and thrombosis of left popliteal vein: Secondary | ICD-10-CM

## 2016-03-11 DIAGNOSIS — I82422 Acute embolism and thrombosis of left iliac vein: Secondary | ICD-10-CM

## 2016-03-11 LAB — CBC WITH DIFFERENTIAL/PLATELET
BASO%: 0.4 % (ref 0.0–2.0)
Basophils Absolute: 0 10*3/uL (ref 0.0–0.1)
EOS%: 2.3 % (ref 0.0–7.0)
Eosinophils Absolute: 0.1 10*3/uL (ref 0.0–0.5)
HCT: 41.3 % (ref 38.4–49.9)
HGB: 14.4 g/dL (ref 13.0–17.1)
LYMPH%: 31.4 % (ref 14.0–49.0)
MCH: 31.1 pg (ref 27.2–33.4)
MCHC: 34.9 g/dL (ref 32.0–36.0)
MCV: 89.2 fL (ref 79.3–98.0)
MONO#: 0.3 10*3/uL (ref 0.1–0.9)
MONO%: 6.2 % (ref 0.0–14.0)
NEUT%: 59.7 % (ref 39.0–75.0)
NEUTROS ABS: 2.9 10*3/uL (ref 1.5–6.5)
PLATELETS: 187 10*3/uL (ref 140–400)
RBC: 4.63 10*6/uL (ref 4.20–5.82)
RDW: 12 % (ref 11.0–14.6)
WBC: 4.8 10*3/uL (ref 4.0–10.3)
lymph#: 1.5 10*3/uL (ref 0.9–3.3)
nRBC: 0 % (ref 0–0)

## 2016-03-11 LAB — PROTIME-INR
INR: 2.2 (ref 2.00–3.50)
Protime: 26.4 Seconds — ABNORMAL HIGH (ref 10.6–13.4)

## 2016-03-11 NOTE — Progress Notes (Signed)
[place] Telephone:([phone]   Fax:(336) [phone]  OFFICE PROGRESS NOTE  No PCP Per Patient No address on file  DIAGNOSIS: History of deep venous thrombosis and pulmonary embolism diagnosed in 2006.  PRIOR THERAPY: None.  CURRENT THERAPY: Coumadin 6 mg by mouth daily except Monday and Thursday the patient will take 8 mg.  INTERVAL HISTORY: Travis Morgan 65 y.o. male returns to the clinic today for 2 month follow-up visit for evaluation of his history of DVT and venous thrombosis and pulmonary embolism. Hypercoagulable workup performed at that time was unremarkable. The patient has been on anticoagulation with Coumadin for the last 10 years. He is currently on Coumadin 6 mg by mouth daily of except Mondays and Thursdays he takes 8 mg. He is tolerating his treatment of Coumadin well. He denied having any bleeding issues. He has no chest pain, shortness of breath, cough or hemoptysis. He continues to have some soreness and swelling on the right lower extremity. He has repeat CBC and iron are performed earlier today and he is here for evaluation and discussion of his lab results and adjustment of his Coumadin dose. His INR today is 2.2 today.  MEDICAL HISTORY:History reviewed. No pertinent past medical history.  ALLERGIES:  has No Known Allergies.  MEDICATIONS:  Current Outpatient Prescriptions  Medication Sig Dispense Refill  . acetaminophen (TYLENOL) 500 MG tablet Take 500 mg by mouth every 6 (six) hours as needed.    . gabapentin (NEURONTIN) 300 MG capsule     . HYDROcodone-ibuprofen (VICOPROFEN) 7.5-200 MG tablet     . lisinopril-hydrochlorothiazide (PRINZIDE,ZESTORETIC) 20-12.5 MG per tablet Take 1 tablet by mouth daily.    Marland Kitchen warfarin (COUMADIN) 2 MG tablet TAKE THREE TABLETS BY MOUTH ONCE DAILY 90 tablet 0   No current facility-administered medications for this visit.     SURGICAL HISTORY: History reviewed. No pertinent surgical history.  REVIEW  OF SYSTEMS:  A comprehensive review of systems was negative except for: Musculoskeletal: positive for muscle weakness   PHYSICAL EXAMINATION: General appearance: alert, cooperative and no distress Head: Normocephalic, without obvious abnormality, atraumatic Neck: no adenopathy, no JVD, supple, symmetrical, trachea midline and thyroid not enlarged, symmetric, no tenderness/mass/nodules Lymph nodes: Cervical, supraclavicular, and axillary nodes normal. Resp: clear to auscultation bilaterally Back: symmetric, no curvature. ROM normal. No CVA tenderness. Cardio: regular rate and rhythm, S1, S2 normal, no murmur, click, rub or gallop GI: soft, non-tender; bowel sounds normal; no masses,  no organomegaly Extremities: extremities normal, atraumatic, no cyanosis or edema  ECOG PERFORMANCE STATUS: 1 - Symptomatic but completely ambulatory  Blood pressure (!) 147/90, pulse 72, temperature 97.8 F (36.6 C), temperature source Oral, resp. rate 18, height 5\' 11"  (1.803 m), weight 174 lb 6.4 oz (79.1 kg), SpO2 100 %.  LABORATORY DATA: Lab Results  Component Value Date   WBC 4.8 03/11/2016   HGB 14.4 03/11/2016   HCT 41.3 03/11/2016   MCV 89.2 03/11/2016   PLT 187 03/11/2016      Chemistry      Component Value Date/Time   NA 141 12/26/2014 0829   K 4.1 12/26/2014 0829   CL 103 12/21/2012 1529   CO2 25 12/26/2014 0829   BUN 12.3 12/26/2014 0829   CREATININE 1.2 12/26/2014 0829      Component Value Date/Time   CALCIUM 9.1 12/26/2014 0829   ALKPHOS 74 12/26/2014 0829   AST 19 12/26/2014 0829   ALT 24 12/26/2014 0829   BILITOT 0.47  12/26/2014 0829       RADIOGRAPHIC STUDIES: No results found.  ASSESSMENT AND PLAN: This is a very pleasant 65 years old African-American male with history of deep venous thrombosis and pulmonary embolism of unclear underlying cause diagnosed in 2006 and has been on anticoagulation with Coumadin since that time. His hypercoagulable workup at that time was  unremarkable. He is currently on Coumadin dose 6 mg by mouth daily except Monday and Thursday he is on 8 mg and tolerating it well. He is therapeutic on the current dose with INR of 2.2. I recommended for the patient to continue on Coumadin to 6 mg by mouth daily except on Monday and Thursday she will take 8 mg.  I will see him back for follow-up visit in 3 months for reevaluation but he will continue to have repeat CBC, PT/INR on monthly basis. For the lower extremity swelling, I will order repeat Doppler of the lower extremities to rule out recurrent DVT. He was advised to call if he has any concerning symptoms in the interval. The patient voices understanding of current disease status and treatment options and is in agreement with the current care plan.  All questions were answered. The patient knows to call the clinic with any problems, questions or concerns. We can certainly see the patient much sooner if necessary.  Disclaimer: This note was dictated with voice recognition software. Similar sounding words can inadvertently be transcribed and may not be corrected upon review.

## 2016-03-11 NOTE — Telephone Encounter (Signed)
Gave patient avs report and appointment for doppler 7/25 @ [facility] - 7/25 ok per desk nurse.

## 2016-03-11 NOTE — Progress Notes (Signed)
Pt unable to drive home due to weakness in legs.Pt helped from chair in scheduling to a wheelchair. Call placed to wife Darlene who advised she will call her sister Olin Hauser to come and get pt. Spoke with pt, advised pt I have asked his wife to call her sister to come and pick him up as it is unsafe for him to drive home. Pt was not happy, voiced he was fine to drive home he would use his left foot to drive. Again discussed with pt he is not able to drive and wife will be calling her sister to arrange for his transportation home. Assured pt wife and sister would assist him in getting his vehicle home. Pt verbalized understanding and stated he would not drive himself home.

## 2016-03-12 ENCOUNTER — Ambulatory Visit (HOSPITAL_COMMUNITY)
Admission: RE | Admit: 2016-03-12 | Discharge: 2016-03-12 | Disposition: A | Discharge status: Home / Self Care | Payer: Medicare HMO | Source: Ambulatory Visit | Attending: Internal Medicine | Admitting: Internal Medicine

## 2016-03-12 DIAGNOSIS — Z7901 Long term (current) use of anticoagulants: Secondary | ICD-10-CM

## 2016-03-12 DIAGNOSIS — M7989 Other specified soft tissue disorders: Secondary | ICD-10-CM | POA: Diagnosis present

## 2016-03-12 DIAGNOSIS — I82532 Chronic embolism and thrombosis of left popliteal vein: Secondary | ICD-10-CM

## 2016-03-12 NOTE — Progress Notes (Signed)
VASCULAR LAB PRELIMINARY  PRELIMINARY  PRELIMINARY  PRELIMINARY  Bilateral lower extremity venous duplex has been completed.     Bilateral:  No evidence of DVT, or superficial thrombosis, or right leg has no evidence of  Baker's Cyst.  Left leg has no evidence of Baker's cyst.  Gave result to [place] ,[place] @12 :00 pm.  Janifer Adie, RVT, RDMS 03/12/2016, 11:54 AM

## 2016-03-25 ENCOUNTER — Other Ambulatory Visit: Payer: Self-pay | Admitting: Internal Medicine

## 2016-04-27 ENCOUNTER — Other Ambulatory Visit: Payer: Self-pay | Admitting: Internal Medicine

## 2016-05-05 ENCOUNTER — Encounter (HOSPITAL_COMMUNITY): Payer: Self-pay

## 2016-05-05 ENCOUNTER — Emergency Department (HOSPITAL_COMMUNITY): Payer: Medicare HMO

## 2016-05-05 ENCOUNTER — Emergency Department (HOSPITAL_COMMUNITY)
Admission: EM | Admit: 2016-05-05 | Discharge: 2016-05-05 | Disposition: A | Discharge status: Home / Self Care | Payer: Medicare HMO | Attending: Emergency Medicine | Admitting: Emergency Medicine

## 2016-05-05 DIAGNOSIS — D169 Benign neoplasm of bone and articular cartilage, unspecified: Secondary | ICD-10-CM

## 2016-05-05 DIAGNOSIS — Y92002 Bathroom of unspecified non-institutional (private) residence single-family (private) house as the place of occurrence of the external cause: Secondary | ICD-10-CM | POA: Diagnosis not present

## 2016-05-05 DIAGNOSIS — Z79899 Other long term (current) drug therapy: Secondary | ICD-10-CM | POA: Insufficient documentation

## 2016-05-05 DIAGNOSIS — Y9301 Activity, walking, marching and hiking: Secondary | ICD-10-CM | POA: Diagnosis not present

## 2016-05-05 DIAGNOSIS — Z87891 Personal history of nicotine dependence: Secondary | ICD-10-CM | POA: Diagnosis not present

## 2016-05-05 DIAGNOSIS — Y999 Unspecified external cause status: Secondary | ICD-10-CM | POA: Diagnosis not present

## 2016-05-05 DIAGNOSIS — D1610 Benign neoplasm of short bones of unspecified upper limb: Secondary | ICD-10-CM | POA: Insufficient documentation

## 2016-05-05 DIAGNOSIS — X501XXA Overexertion from prolonged static or awkward postures, initial encounter: Secondary | ICD-10-CM | POA: Insufficient documentation

## 2016-05-05 DIAGNOSIS — Z7901 Long term (current) use of anticoagulants: Secondary | ICD-10-CM | POA: Insufficient documentation

## 2016-05-05 DIAGNOSIS — I10 Essential (primary) hypertension: Secondary | ICD-10-CM | POA: Insufficient documentation

## 2016-05-05 DIAGNOSIS — M25571 Pain in right ankle and joints of right foot: Secondary | ICD-10-CM | POA: Diagnosis not present

## 2016-05-05 DIAGNOSIS — W19XXXA Unspecified fall, initial encounter: Secondary | ICD-10-CM

## 2016-05-05 HISTORY — DX: Cerebral infarction, unspecified: I63.9

## 2016-05-05 HISTORY — DX: Acute embolism and thrombosis of unspecified deep veins of unspecified lower extremity: I82.409

## 2016-05-05 MED ORDER — TRAMADOL HCL 50 MG PO TABS: 50.0000 mg | ORAL_TABLET | Freq: Four times a day (QID) | ORAL | 0 refills | Status: AC | PRN

## 2016-05-05 MED ORDER — HYDROCODONE-ACETAMINOPHEN 5-325 MG PO TABS
1.0000 | ORAL_TABLET | Freq: Once | ORAL | Status: AC
  Administered 2016-05-05: 1 via ORAL
  Filled 2016-05-05: qty 1

## 2016-05-05 NOTE — ED Triage Notes (Addendum)
Pt states he has been falling recently. Started several days ago.  Pt fell yesterday.  Pt states falling b/c he has had pain in rt calf.  Pain goes to ankle and ankle is swollen.  Pt denies unilateral weakness or changes in speech.

## 2016-05-05 NOTE — Discharge Instructions (Signed)
Wear brace and take pain medication as directed.

## 2016-05-05 NOTE — ED Provider Notes (Signed)
[facility] DEPT Provider Note   CSN: [id number] Arrival date & time: 05/05/16  0904     History   Chief Complaint Chief Complaint  Patient presents with  . Fall  . Leg Pain    HPI Travis Morgan is a 65 y.o. male.  HPI Patient presents emergency room with complaints of pain to the right ankle and distal right leg.  Patient has numerous falls.  He walks with a cane.  He's had history of a stroke which involved the right side of his body he told me. Past Medical History:  Diagnosis Date  . DVT (deep venous thrombosis) ([place])   . Stroke [facility])     Patient Active Problem List   Diagnosis Date Noted  . Anticoagulation monitoring, INR range 2-3 03/11/2016  . Chronic deep vein thrombosis (DVT) of left lower extremity ([place]) 10/18/2015  . 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 Hypertension 06/02/2012  . Pulmonary embolism ([place]) 08/23/2011  . Left leg DVT ([place]) 08/23/2011    History reviewed. No pertinent surgical history.     Home Medications    Prior to Admission medications   Medication Sig Start Date End Date Taking? Authorizing Provider  acetaminophen (TYLENOL) 500 MG tablet Take 500 mg by mouth every 6 (six) hours as needed for mild pain.    Yes Historical Provider, MD  gabapentin (NEURONTIN) 300 MG capsule Take 300 mg by mouth daily.  01/26/16  Yes Historical Provider, MD  HYDROcodone-ibuprofen (VICOPROFEN) 7.5-200 MG tablet Take 1 tablet by mouth every 6 (six) hours as needed for moderate pain.  01/26/16  Yes Historical Provider, MD  lisinopril-hydrochlorothiazide (PRINZIDE,ZESTORETIC) 20-12.5 MG per tablet Take 1 tablet by mouth daily. 12/02/12  Yes Historical Provider, MD  warfarin (COUMADIN) 2 MG tablet TAKE THREE TABLETS BY MOUTH ONCE DAILY 04/29/16  Yes Curt Bears, MD  traMADol (ULTRAM) 50 MG tablet Take 1 tablet (50 mg total) by mouth every 6 (six) hours as needed. 05/05/16   Leonard Schwartz, MD    Family History History reviewed. No  pertinent family history.  Social History Social History  Substance Use Topics  . Smoking status: Former Smoker    Quit date: 03/15/2005  . Smokeless tobacco: Never Used  . Alcohol use 0.6 oz/week    1 Cans of beer per week     Allergies   Review of patient's allergies indicates no known allergies.   Review of Systems Review of Systems All other systems reviewed and are negative  Physical Exam Updated Vital Signs BP 154/91 (BP Location: Left Arm)   Pulse 71   Temp 97.9 F (36.6 C) (Oral)   Resp 18   Ht 6' (1.829 m)   Wt 175 lb (79.4 kg)   SpO2 100%   BMI 23.73 kg/m   Physical Exam  Physical Exam  Nursing note and vitals reviewed. Constitutional: He is oriented to person, place, and time. He appears well-developed and well-nourished. No distress.  HENT:  Head: Normocephalic and atraumatic.  Eyes: Pupils are equal, round, and reactive to light.  Neck: Normal range of motion.  Cardiovascular: Normal rate and intact distal pulses.   Pulmonary/Chest: No respiratory distress.  Abdominal: Normal appearance. He exhibits no distension.  Musculoskeletal: Patient has tenderness to palpation along the distal fibula on the right side and also right ankle.  Some swelling to the ankle noted. Neurological: He is alert and oriented to person, place, and time. No cranial nerve deficit.  Skin: Skin is warm and dry.  No rash noted.  Psychiatric: He has a normal mood and affect. His behavior is normal.   ED Treatments / Results  Labs (all labs ordered are listed, but only abnormal results are displayed) Labs Reviewed - No data to display  EKG  EKG Interpretation None       Radiology Dg Tibia/fibula Right  Result Date: 05/05/2016 CLINICAL DATA:  Twist injury to ankle walking into the bathroom this morning causing him to fall onto RIGHT leg EXAM: RIGHT TIBIA AND FIBULA - 2 VIEW COMPARISON:  None FINDINGS: Ankle exclude, image and reported separately. Osseous demineralization.  Minimal knee joint space narrowing. Curvilinear area of sclerosis in the proximal RIGHT tibial diaphysis 5.2 cm length. Additional area of mixed sclerosis in the distal tibial diaphysis approximately 5.0 cm length. These could represent areas of prior bone infarction or enchondroma. No acute fracture, dislocation, or bone destruction. IMPRESSION: Areas of sclerosis within the proximal and distal RIGHT tibial diaphysis question bone infarct versus enchondroma. No acute fracture or dislocation. Osseous demineralization with minimal degenerative changes RIGHT knee. Electronically Signed   By: Lavonia Dana M.D.   On: 05/05/2016 10:13   Dg Ankle Complete Right  Result Date: 05/05/2016 CLINICAL DATA:  Twisted ankle walking to the bathroom this morning, fell onto RIGHT leg, pain, initial encounter EXAM: RIGHT ANKLE - COMPLETE 3+ VIEW COMPARISON:  None FINDINGS: Osseous demineralization. Joint spaces preserved. Stippled area of sclerosis in the distal RIGHT tibial diaphysis question enchondroma or less likely sequela bone infarct. No acute fracture, dislocation, or bone destruction. IMPRESSION: No acute fracture dislocation. Question enchondroma versus less likely bone infarct at distal tibia. Electronically Signed   By: Lavonia Dana M.D.   On: 05/05/2016 10:15    Procedures Procedures (including critical care time)  Medications Ordered in ED Medications  HYDROcodone-acetaminophen (NORCO/VICODIN) 5-325 MG per tablet 1 tablet (1 tablet Oral Given 05/05/16 1006)     Initial Impression / Assessment and Plan / ED Course  I have reviewed the triage vital signs and the nursing notes.  Pertinent labs & imaging results that were available during my care of the patient were reviewed by me and considered in my medical decision making (see chart for details).  Clinical Course      Final Clinical Impressions(s) / ED Diagnoses   Final diagnoses:  Fall, initial encounter  Enchondroma    New  Prescriptions New Prescriptions   TRAMADOL (ULTRAM) 50 MG TABLET    Take 1 tablet (50 mg total) by mouth every 6 (six) hours as needed.     Leonard Schwartz, MD 05/05/16 [phone]

## 2016-05-30 ENCOUNTER — Other Ambulatory Visit: Payer: Self-pay | Admitting: Internal Medicine

## 2016-06-11 ENCOUNTER — Ambulatory Visit (HOSPITAL_BASED_OUTPATIENT_CLINIC_OR_DEPARTMENT_OTHER): Payer: Medicare HMO | Admitting: Internal Medicine

## 2016-06-11 ENCOUNTER — Other Ambulatory Visit (HOSPITAL_BASED_OUTPATIENT_CLINIC_OR_DEPARTMENT_OTHER): Payer: Medicare HMO

## 2016-06-11 ENCOUNTER — Telehealth: Payer: Self-pay | Admitting: Internal Medicine

## 2016-06-11 ENCOUNTER — Encounter: Payer: Self-pay | Admitting: Internal Medicine

## 2016-06-11 VITALS — BP 161/74 | HR 63 | Temp 97.6°F | Resp 18 | Ht 72.0 in | Wt 181.3 lb

## 2016-06-11 DIAGNOSIS — Z7901 Long term (current) use of anticoagulants: Secondary | ICD-10-CM

## 2016-06-11 DIAGNOSIS — Z86718 Personal history of other venous thrombosis and embolism: Secondary | ICD-10-CM | POA: Diagnosis not present

## 2016-06-11 DIAGNOSIS — I82532 Chronic embolism and thrombosis of left popliteal vein: Secondary | ICD-10-CM

## 2016-06-11 DIAGNOSIS — I2782 Chronic pulmonary embolism: Secondary | ICD-10-CM

## 2016-06-11 DIAGNOSIS — Z86711 Personal history of pulmonary embolism: Secondary | ICD-10-CM

## 2016-06-11 LAB — CBC WITH DIFFERENTIAL/PLATELET
BASO%: 0.5 % (ref 0.0–2.0)
BASOS ABS: 0 10*3/uL (ref 0.0–0.1)
EOS%: 6.7 % (ref 0.0–7.0)
Eosinophils Absolute: 0.4 10*3/uL (ref 0.0–0.5)
HCT: 44.6 % (ref 38.4–49.9)
HGB: 14.4 g/dL (ref 13.0–17.1)
LYMPH%: 27.6 % (ref 14.0–49.0)
MCH: 30.5 pg (ref 27.2–33.4)
MCHC: 32.3 g/dL (ref 32.0–36.0)
MCV: 94.6 fL (ref 79.3–98.0)
MONO#: 0.5 10*3/uL (ref 0.1–0.9)
MONO%: 8.8 % (ref 0.0–14.0)
NEUT#: 3 10*3/uL (ref 1.5–6.5)
NEUT%: 56.4 % (ref 39.0–75.0)
Platelets: 182 10*3/uL (ref 140–400)
RBC: 4.71 10*6/uL (ref 4.20–5.82)
RDW: 12.5 % (ref 11.0–14.6)
WBC: 5.2 10*3/uL (ref 4.0–10.3)
lymph#: 1.4 10*3/uL (ref 0.9–3.3)

## 2016-06-11 LAB — PROTIME-INR
INR: 2.4 (ref 2.00–3.50)
Protime: 28.8 Seconds — ABNORMAL HIGH (ref 10.6–13.4)

## 2016-06-11 NOTE — Telephone Encounter (Signed)
AVS report and appointment schedule given to patient, per 06/11/16 los. °

## 2016-06-11 NOTE — Progress Notes (Signed)
[facility] [phone]   Fax:(336) [place], MD [place] [address]  DIAGNOSIS: History of deep venous thrombosis and pulmonary embolism diagnosed in 2006.  PRIOR THERAPY: None.  CURRENT THERAPY: Coumadin 6 mg by mouth daily except Monday and Thursday the patient takes 8 mg.  INTERVAL HISTORY: Travis Morgan 65 y.o. male returns to the clinic today for 2 month follow-up visit for evaluation of his history of DVT and venous thrombosis and pulmonary embolism. Hypercoagulable workup performed at that time was unremarkable. The patient has been on anticoagulation with Coumadin for the last 10 years. He is currently on Coumadin 6 mg by mouth daily of except Mondays and Thursdays he takes 8 mg. The patient is feeling fine today was no specific complaints. He is tolerating his treatment of Coumadin well. He denied having any bleeding issues. He has no chest pain, shortness of breath, cough or hemoptysis. He has repeat CBC and iron are performed earlier today and he is here for evaluation and discussion of his lab results and adjustment of his Coumadin dose. His INR today is 2.4 today.  MEDICAL HISTORY: Past Medical History:  Diagnosis Date  . DVT (deep venous thrombosis) ([place])   . Stroke [facility])     ALLERGIES:  has No Known Allergies.  MEDICATIONS:  Current Outpatient Prescriptions  Medication Sig Dispense Refill  . acetaminophen (TYLENOL) 500 MG tablet Take 500 mg by mouth every 6 (six) hours as needed for mild pain.     Marland Kitchen gabapentin (NEURONTIN) 300 MG capsule Take 300 mg by mouth daily.     Marland Kitchen HYDROcodone-ibuprofen (VICOPROFEN) 7.5-200 MG tablet Take 1 tablet by mouth every 6 (six) hours as needed for moderate pain.     Marland Kitchen lisinopril-hydrochlorothiazide (PRINZIDE,ZESTORETIC) 20-12.5 MG per tablet Take 1 tablet by mouth daily.    . traMADol (ULTRAM) 50 MG tablet Take 1 tablet (50 mg total) by  mouth every 6 (six) hours as needed. 15 tablet 0  . warfarin (COUMADIN) 2 MG tablet TAKE THREE TABLETS BY MOUTH ONCE DAILY 90 tablet 0   No current facility-administered medications for this visit.     SURGICAL HISTORY: No past surgical history on file.  REVIEW OF SYSTEMS:  A comprehensive review of systems was negative except for: Musculoskeletal: positive for muscle weakness   PHYSICAL EXAMINATION: General appearance: alert, cooperative and no distress Head: Normocephalic, without obvious abnormality, atraumatic Neck: no adenopathy, no JVD, supple, symmetrical, trachea midline and thyroid not enlarged, symmetric, no tenderness/mass/nodules Lymph nodes: Cervical, supraclavicular, and axillary nodes normal. Resp: clear to auscultation bilaterally Back: symmetric, no curvature. ROM normal. No CVA tenderness. Cardio: regular rate and rhythm, S1, S2 normal, no murmur, click, rub or gallop GI: soft, non-tender; bowel sounds normal; no masses,  no organomegaly Extremities: extremities normal, atraumatic, no cyanosis or edema  ECOG PERFORMANCE STATUS: 1 - Symptomatic but completely ambulatory  There were no vitals taken for this visit.  LABORATORY DATA: Lab Results  Component Value Date   WBC 4.8 03/11/2016   HGB 14.4 03/11/2016   HCT 41.3 03/11/2016   MCV 89.2 03/11/2016   PLT 187 03/11/2016      Chemistry      Component Value Date/Time   NA 141 12/26/2014 0829   K 4.1 12/26/2014 0829   CL 103 12/21/2012 1529   CO2 25 12/26/2014 0829   BUN 12.3 12/26/2014 0829   CREATININE 1.2  12/26/2014 0829      Component Value Date/Time   CALCIUM 9.1 12/26/2014 0829   ALKPHOS 74 12/26/2014 0829   AST 19 12/26/2014 0829   ALT 24 12/26/2014 0829   BILITOT 0.47 12/26/2014 0829       RADIOGRAPHIC STUDIES: No results found.  ASSESSMENT AND PLAN: This is a very pleasant 65 years old African-American male with history of deep venous thrombosis and pulmonary embolism of unclear  underlying cause diagnosed in 2006 and has been on anticoagulation with Coumadin since that time. His hypercoagulable workup at that time was unremarkable. He is currently on Coumadin dose 6 mg by mouth daily except Monday and Thursday he is on 8 mg and tolerating it well. He is therapeutic on the current dose with INR of 2.4. I recommended for the patient to continue on Coumadin with the same dose.  I will see him back for follow-up visit in 3 months for reevaluation with repeat CBC, PT/INR on monthly basis. He was advised to call if he has any concerning symptoms in the interval. The patient voices understanding of current disease status and treatment options and is in agreement with the current care plan.  All questions were answered. The patient knows to call the clinic with any problems, questions or concerns. We can certainly see the patient much sooner if necessary.  Disclaimer: This note was dictated with voice recognition software. Similar sounding words can inadvertently be transcribed and may not be corrected upon review.

## 2016-07-08 ENCOUNTER — Other Ambulatory Visit: Payer: Self-pay | Admitting: Internal Medicine

## 2016-08-05 ENCOUNTER — Other Ambulatory Visit: Payer: Self-pay | Admitting: Internal Medicine

## 2016-09-04 ENCOUNTER — Other Ambulatory Visit: Payer: Self-pay | Admitting: Internal Medicine

## 2016-09-10 ENCOUNTER — Other Ambulatory Visit (HOSPITAL_BASED_OUTPATIENT_CLINIC_OR_DEPARTMENT_OTHER): Payer: Medicare HMO

## 2016-09-10 ENCOUNTER — Ambulatory Visit (HOSPITAL_BASED_OUTPATIENT_CLINIC_OR_DEPARTMENT_OTHER): Payer: Medicare HMO | Admitting: Internal Medicine

## 2016-09-10 ENCOUNTER — Telehealth: Payer: Self-pay | Admitting: Internal Medicine

## 2016-09-10 ENCOUNTER — Encounter: Payer: Self-pay | Admitting: Internal Medicine

## 2016-09-10 VITALS — BP 134/78 | HR 75 | Temp 97.7°F | Resp 18 | Ht 72.0 in | Wt 189.4 lb

## 2016-09-10 DIAGNOSIS — Z86711 Personal history of pulmonary embolism: Secondary | ICD-10-CM | POA: Diagnosis not present

## 2016-09-10 DIAGNOSIS — I82422 Acute embolism and thrombosis of left iliac vein: Secondary | ICD-10-CM

## 2016-09-10 DIAGNOSIS — Z7901 Long term (current) use of anticoagulants: Secondary | ICD-10-CM | POA: Diagnosis not present

## 2016-09-10 DIAGNOSIS — Z86718 Personal history of other venous thrombosis and embolism: Secondary | ICD-10-CM

## 2016-09-10 DIAGNOSIS — I2782 Chronic pulmonary embolism: Secondary | ICD-10-CM

## 2016-09-10 DIAGNOSIS — I82532 Chronic embolism and thrombosis of left popliteal vein: Secondary | ICD-10-CM

## 2016-09-10 LAB — CBC WITH DIFFERENTIAL/PLATELET
BASO%: 1 % (ref 0.0–2.0)
Basophils Absolute: 0 10*3/uL (ref 0.0–0.1)
EOS ABS: 0.2 10*3/uL (ref 0.0–0.5)
EOS%: 5.5 % (ref 0.0–7.0)
HEMATOCRIT: 43.6 % (ref 38.4–49.9)
HGB: 14.9 g/dL (ref 13.0–17.1)
LYMPH#: 1.5 10*3/uL (ref 0.9–3.3)
LYMPH%: 38.1 % (ref 14.0–49.0)
MCH: 31.2 pg (ref 27.2–33.4)
MCHC: 34.2 g/dL (ref 32.0–36.0)
MCV: 91.2 fL (ref 79.3–98.0)
MONO#: 0.3 10*3/uL (ref 0.1–0.9)
MONO%: 7.7 % (ref 0.0–14.0)
NEUT%: 47.7 % (ref 39.0–75.0)
NEUTROS ABS: 1.9 10*3/uL (ref 1.5–6.5)
NRBC: 0 % (ref 0–0)
PLATELETS: 209 10*3/uL (ref 140–400)
RBC: 4.78 10*6/uL (ref 4.20–5.82)
RDW: 11.8 % (ref 11.0–14.6)
WBC: 4 10*3/uL (ref 4.0–10.3)

## 2016-09-10 LAB — PROTIME-INR
INR: 2.1 (ref 2.00–3.50)
Protime: 25.2 Seconds — ABNORMAL HIGH (ref 10.6–13.4)

## 2016-09-10 NOTE — Telephone Encounter (Signed)
Appointments scheduled per 09/10/16 los. Patient was given a copy of the appointment schedule and AVS report, per 09/10/16 los. °

## 2016-09-10 NOTE — Progress Notes (Signed)
[facility] [phone]   Fax:(336) [place], MD [place] [address]  DIAGNOSIS: History of deep venous thrombosis and pulmonary embolism diagnosed in 2006.  PRIOR THERAPY: None.  CURRENT THERAPY: Coumadin 6 mg by mouth daily except Monday and Thursday the patient takes 8 mg.  INTERVAL HISTORY: Travis Morgan 66 y.o. male came to the clinic today for follow-up visit. The patient is feeling fine today with no specific complaints. He is tolerating his treatment with Coumadin fairly well with no significant adverse effects. He denied having any bleeding issues. He is currently on Coumadin 6 mg by mouth daily except Monday and Thursday he takes 8 mg. The patient denied having any chest pain, shortness breath, cough or hemoptysis. He has no nausea or vomiting. He denied having any fever or chills. He is recovering from recent cold symptoms. He is here today for evaluation and repeat CBC and PT/INR.  MEDICAL HISTORY: Past Medical History:  Diagnosis Date  . DVT (deep venous thrombosis) ([place])   . Stroke [facility])     ALLERGIES:  has No Known Allergies.  MEDICATIONS:  Current Outpatient Prescriptions  Medication Sig Dispense Refill  . acetaminophen (TYLENOL) 500 MG tablet Take 500 mg by mouth every 6 (six) hours as needed for mild pain.     Marland Kitchen gabapentin (NEURONTIN) 300 MG capsule Take 300 mg by mouth daily.     Marland Kitchen HYDROcodone-ibuprofen (VICOPROFEN) 7.5-200 MG tablet Take 1 tablet by mouth every 6 (six) hours as needed for moderate pain.     Marland Kitchen lisinopril-hydrochlorothiazide (PRINZIDE,ZESTORETIC) 20-12.5 MG per tablet Take 1 tablet by mouth daily.    . traMADol (ULTRAM) 50 MG tablet Take 1 tablet (50 mg total) by mouth every 6 (six) hours as needed. 15 tablet 0  . warfarin (COUMADIN) 2 MG tablet TAKE THREE TABLETS BY MOUTH ONCE DAILY 90 tablet 0   No current facility-administered medications for  this visit.     SURGICAL HISTORY: History reviewed. No pertinent surgical history.  REVIEW OF SYSTEMS:  A comprehensive review of systems was negative.   PHYSICAL EXAMINATION: General appearance: alert, cooperative and no distress Head: Normocephalic, without obvious abnormality, atraumatic Neck: no adenopathy, no JVD, supple, symmetrical, trachea midline and thyroid not enlarged, symmetric, no tenderness/mass/nodules Lymph nodes: Cervical, supraclavicular, and axillary nodes normal. Resp: clear to auscultation bilaterally Back: symmetric, no curvature. ROM normal. No CVA tenderness. Cardio: regular rate and rhythm, S1, S2 normal, no murmur, click, rub or gallop GI: soft, non-tender; bowel sounds normal; no masses,  no organomegaly Extremities: extremities normal, atraumatic, no cyanosis or edema  ECOG PERFORMANCE STATUS: 0 - Asymptomatic  Blood pressure 134/78, pulse 75, temperature 97.7 F (36.5 C), temperature source Oral, resp. rate 18, height 6' (1.829 m), weight 189 lb 6.4 oz (85.9 kg), SpO2 100 %.  LABORATORY DATA: Lab Results  Component Value Date   WBC 4.0 09/10/2016   HGB 14.9 09/10/2016   HCT 43.6 09/10/2016   MCV 91.2 09/10/2016   PLT 209 09/10/2016      Chemistry      Component Value Date/Time   NA 141 12/26/2014 0829   K 4.1 12/26/2014 0829   CL 103 12/21/2012 1529   CO2 25 12/26/2014 0829   BUN 12.3 12/26/2014 0829   CREATININE 1.2 12/26/2014 0829      Component Value Date/Time   CALCIUM 9.1 12/26/2014 0829   ALKPHOS 74 12/26/2014  0829   AST 19 12/26/2014 0829   ALT 24 12/26/2014 0829   BILITOT 0.47 12/26/2014 0829       RADIOGRAPHIC STUDIES: No results found.  ASSESSMENT AND PLAN:  This is a very pleasant 66 years old African-American male with history of DVT and is thrombosis and pulmonary embolism diagnosed in 2006 and he has been on chronic anticoagulation with Coumadin since that time. The patient is doing fine and his PT/INR is within the  therapeutic range. I recommended for him to continue with the current dose of Coumadin 6 mg by mouth daily except Monday and Thursday he takes 8 mg. I will send back for follow-up visit in 4 months for reevaluation with repeat blood work. He was advised to call immediately if he has any concerning symptoms in the interval. The patient voices understanding of current disease status and treatment options and is in agreement with the current care plan.  All questions were answered. The patient knows to call the clinic with any problems, questions or concerns. We can certainly see the patient much sooner if necessary. I spent 10 minutes counseling the patient face to face. The total time spent in the appointment was 15 minutes.  Disclaimer: This note was dictated with voice recognition software. Similar sounding words can inadvertently be transcribed and may not be corrected upon review.

## 2016-10-02 ENCOUNTER — Other Ambulatory Visit: Payer: Self-pay | Admitting: Internal Medicine

## 2016-11-01 ENCOUNTER — Other Ambulatory Visit: Payer: Self-pay | Admitting: Internal Medicine

## 2016-11-30 ENCOUNTER — Other Ambulatory Visit: Payer: Self-pay | Admitting: Internal Medicine

## 2017-01-02 ENCOUNTER — Other Ambulatory Visit: Payer: Self-pay | Admitting: Internal Medicine

## 2017-01-08 ENCOUNTER — Telehealth: Payer: Self-pay | Admitting: Internal Medicine

## 2017-01-08 ENCOUNTER — Encounter: Payer: Self-pay | Admitting: Internal Medicine

## 2017-01-08 ENCOUNTER — Ambulatory Visit (HOSPITAL_BASED_OUTPATIENT_CLINIC_OR_DEPARTMENT_OTHER): Payer: Medicare HMO | Admitting: Internal Medicine

## 2017-01-08 ENCOUNTER — Other Ambulatory Visit (HOSPITAL_BASED_OUTPATIENT_CLINIC_OR_DEPARTMENT_OTHER): Payer: Medicare HMO

## 2017-01-08 VITALS — BP 145/79 | HR 58 | Temp 97.8°F | Resp 19 | Ht 72.0 in | Wt 183.8 lb

## 2017-01-08 DIAGNOSIS — I2609 Other pulmonary embolism with acute cor pulmonale: Secondary | ICD-10-CM

## 2017-01-08 DIAGNOSIS — Z86711 Personal history of pulmonary embolism: Secondary | ICD-10-CM | POA: Diagnosis not present

## 2017-01-08 DIAGNOSIS — Z7901 Long term (current) use of anticoagulants: Secondary | ICD-10-CM

## 2017-01-08 DIAGNOSIS — I825Z2 Chronic embolism and thrombosis of unspecified deep veins of left distal lower extremity: Secondary | ICD-10-CM

## 2017-01-08 DIAGNOSIS — Z86718 Personal history of other venous thrombosis and embolism: Secondary | ICD-10-CM | POA: Diagnosis not present

## 2017-01-08 DIAGNOSIS — I2782 Chronic pulmonary embolism: Principal | ICD-10-CM

## 2017-01-08 DIAGNOSIS — I82422 Acute embolism and thrombosis of left iliac vein: Secondary | ICD-10-CM

## 2017-01-08 LAB — PROTIME-INR
INR: 1.6 — AB (ref 2.00–3.50)
Protime: 19.2 Seconds — ABNORMAL HIGH (ref 10.6–13.4)

## 2017-01-08 LAB — CBC WITH DIFFERENTIAL/PLATELET
BASO%: 1.1 % (ref 0.0–2.0)
Basophils Absolute: 0 10*3/uL (ref 0.0–0.1)
EOS%: 4.3 % (ref 0.0–7.0)
Eosinophils Absolute: 0.2 10*3/uL (ref 0.0–0.5)
HEMATOCRIT: 42.1 % (ref 38.4–49.9)
HGB: 14.5 g/dL (ref 13.0–17.1)
LYMPH%: 36.9 % (ref 14.0–49.0)
MCH: 31.3 pg (ref 27.2–33.4)
MCHC: 34.4 g/dL (ref 32.0–36.0)
MCV: 90.9 fL (ref 79.3–98.0)
MONO#: 0.2 10*3/uL (ref 0.1–0.9)
MONO%: 6.5 % (ref 0.0–14.0)
NEUT%: 51.2 % (ref 39.0–75.0)
NEUTROS ABS: 1.8 10*3/uL (ref 1.5–6.5)
Platelets: 195 10*3/uL (ref 140–400)
RBC: 4.63 10*6/uL (ref 4.20–5.82)
RDW: 11.8 % (ref 11.0–14.6)
WBC: 3.5 10*3/uL — ABNORMAL LOW (ref 4.0–10.3)
lymph#: 1.3 10*3/uL (ref 0.9–3.3)
nRBC: 0 % (ref 0–0)

## 2017-01-08 NOTE — Telephone Encounter (Signed)
Scheduled appt per 5/23 los - Gave patient avs and calender.

## 2017-01-08 NOTE — Progress Notes (Signed)
[facility] [phone]   Fax:(336) [phone]  OFFICE PROGRESS NOTE  [place] Junior, MD [place] [address]  DIAGNOSIS: History of deep venous thrombosis and pulmonary embolism diagnosed in 2006.  PRIOR THERAPY: None.  CURRENT THERAPY: Coumadin 6 mg by mouth daily except Monday and Thursday the patient takes 8 mg.  INTERVAL HISTORY: Travis Morgan 66 y.o. male returns to the clinic today for six-month follow-up visit. The patient is feeling fine today with no specific complaints. He denied having any bleeding, bruises or ecchymosis. He is tolerating his current treatment with Coumadin fairly well. He lost few days of treatment because of delay of refilling his medication by her pharmacy. He denied having any chest pain, shortness of breath, cough or hemoptysis. He denied having any fever or chills. He has no nausea, vomiting, diarrhea or constipation. He is here today for evaluation and repeat blood work.  MEDICAL HISTORY: Past Medical History:  Diagnosis Date  . DVT (deep venous thrombosis) ([place])   . Stroke [facility])     ALLERGIES:  has No Known Allergies.  MEDICATIONS:  Current Outpatient Prescriptions  Medication Sig Dispense Refill  . acetaminophen (TYLENOL) 500 MG tablet Take 500 mg by mouth every 6 (six) hours as needed for mild pain.     Marland Kitchen gabapentin (NEURONTIN) 300 MG capsule Take 300 mg by mouth daily.     Marland Kitchen HYDROcodone-ibuprofen (VICOPROFEN) 7.5-200 MG tablet Take 1 tablet by mouth every 6 (six) hours as needed for moderate pain.     Marland Kitchen lisinopril-hydrochlorothiazide (PRINZIDE,ZESTORETIC) 20-12.5 MG per tablet Take 1 tablet by mouth daily.    . traMADol (ULTRAM) 50 MG tablet Take 1 tablet (50 mg total) by mouth every 6 (six) hours as needed. 15 tablet 0  . warfarin (COUMADIN) 2 MG tablet TAKE 3 TABLETS BY MOUTH ONCE DAILY 90 tablet 0   No current facility-administered medications for this visit.     SURGICAL  HISTORY: History reviewed. No pertinent surgical history.  REVIEW OF SYSTEMS:  A comprehensive review of systems was negative.   PHYSICAL EXAMINATION: General appearance: alert, cooperative and no distress Head: Normocephalic, without obvious abnormality, atraumatic Neck: no adenopathy, no JVD, supple, symmetrical, trachea midline and thyroid not enlarged, symmetric, no tenderness/mass/nodules Lymph nodes: Cervical, supraclavicular, and axillary nodes normal. Resp: clear to auscultation bilaterally Back: symmetric, no curvature. ROM normal. No CVA tenderness. Cardio: regular rate and rhythm, S1, S2 normal, no murmur, click, rub or gallop GI: soft, non-tender; bowel sounds normal; no masses,  no organomegaly Extremities: extremities normal, atraumatic, no cyanosis or edema  ECOG PERFORMANCE STATUS: 0 - Asymptomatic  Blood pressure (!) 145/79, pulse (!) 58, temperature 97.8 F (36.6 C), temperature source Oral, resp. rate 19, height 6' (1.829 m), weight 183 lb 12.8 oz (83.4 kg), SpO2 100 %.  LABORATORY DATA: Lab Results  Component Value Date   WBC 3.5 (L) 01/08/2017   HGB 14.5 01/08/2017   HCT 42.1 01/08/2017   MCV 90.9 01/08/2017   PLT 195 01/08/2017      Chemistry      Component Value Date/Time   NA 141 12/26/2014 0829   K 4.1 12/26/2014 0829   CL 103 12/21/2012 1529   CO2 25 12/26/2014 0829   BUN 12.3 12/26/2014 0829   CREATININE 1.2 12/26/2014 0829      Component Value Date/Time   CALCIUM 9.1 12/26/2014 0829   ALKPHOS 74 12/26/2014 0829   AST 19 12/26/2014  0829   ALT 24 12/26/2014 0829   BILITOT 0.47 12/26/2014 0829       RADIOGRAPHIC STUDIES: No results found.  ASSESSMENT AND PLAN:  This is a very pleasant 66 years old African-American male with history of deep venous thrombosis as well as pulmonary embolism and 2006 and has been on treatment with chronic anticoagulation with Coumadin since that time. The patient is doing fine and tolerating his treatment with  Coumadin while. His PT/INR is subtherapeutic today mainly because the patient missed several doses Claritin for refill of his medication. I recommended for the patient to continue on the same dose of his treatment. He would come back for follow-up visit in 6 months for reevaluation with repeat CBC, PT/INR. The patient was advised to call immediately if she has any concerning symptoms in the interval. The patient voices understanding of current disease status and treatment options and is in agreement with the current care plan. All questions were answered. The patient knows to call the clinic with any problems, questions or concerns. We can certainly see the patient much sooner if necessary. I spent 10 minutes counseling the patient face to face. The total time spent in the appointment was 15 minutes.  Disclaimer: This note was dictated with voice recognition software. Similar sounding words can inadvertently be transcribed and may not be corrected upon review.

## 2017-01-30 ENCOUNTER — Other Ambulatory Visit: Payer: Self-pay | Admitting: Internal Medicine

## 2017-02-03 ENCOUNTER — Other Ambulatory Visit: Payer: Self-pay | Admitting: Medical Oncology

## 2017-03-05 ENCOUNTER — Other Ambulatory Visit: Payer: Self-pay | Admitting: Internal Medicine

## 2017-04-04 ENCOUNTER — Other Ambulatory Visit: Payer: Self-pay | Admitting: Internal Medicine

## 2017-05-01 ENCOUNTER — Other Ambulatory Visit: Payer: Self-pay | Admitting: Internal Medicine

## 2017-06-02 ENCOUNTER — Other Ambulatory Visit: Payer: Self-pay | Admitting: Internal Medicine

## 2017-06-30 ENCOUNTER — Other Ambulatory Visit: Payer: Self-pay | Admitting: Internal Medicine

## 2017-07-14 ENCOUNTER — Telehealth: Payer: Self-pay | Admitting: Internal Medicine

## 2017-07-14 ENCOUNTER — Other Ambulatory Visit (HOSPITAL_BASED_OUTPATIENT_CLINIC_OR_DEPARTMENT_OTHER): Payer: Medicare HMO

## 2017-07-14 ENCOUNTER — Encounter: Payer: Self-pay | Admitting: Internal Medicine

## 2017-07-14 ENCOUNTER — Ambulatory Visit (HOSPITAL_BASED_OUTPATIENT_CLINIC_OR_DEPARTMENT_OTHER): Payer: Medicare HMO | Admitting: Internal Medicine

## 2017-07-14 VITALS — BP 169/75 | HR 59 | Temp 97.9°F | Resp 20 | Ht 72.0 in | Wt 183.8 lb

## 2017-07-14 DIAGNOSIS — Z86718 Personal history of other venous thrombosis and embolism: Secondary | ICD-10-CM

## 2017-07-14 DIAGNOSIS — Z7901 Long term (current) use of anticoagulants: Secondary | ICD-10-CM

## 2017-07-14 DIAGNOSIS — Z86711 Personal history of pulmonary embolism: Secondary | ICD-10-CM

## 2017-07-14 DIAGNOSIS — I2609 Other pulmonary embolism with acute cor pulmonale: Secondary | ICD-10-CM

## 2017-07-14 DIAGNOSIS — I2782 Chronic pulmonary embolism: Secondary | ICD-10-CM

## 2017-07-14 DIAGNOSIS — I825Z2 Chronic embolism and thrombosis of unspecified deep veins of left distal lower extremity: Secondary | ICD-10-CM

## 2017-07-14 LAB — CBC WITH DIFFERENTIAL/PLATELET
BASO%: 0.8 % (ref 0.0–2.0)
Basophils Absolute: 0 10*3/uL (ref 0.0–0.1)
EOS ABS: 0.1 10*3/uL (ref 0.0–0.5)
EOS%: 3.1 % (ref 0.0–7.0)
HCT: 43.6 % (ref 38.4–49.9)
HGB: 14.7 g/dL (ref 13.0–17.1)
LYMPH%: 42.4 % (ref 14.0–49.0)
MCH: 30.4 pg (ref 27.2–33.4)
MCHC: 33.7 g/dL (ref 32.0–36.0)
MCV: 90.1 fL (ref 79.3–98.0)
MONO#: 0.2 10*3/uL (ref 0.1–0.9)
MONO%: 6.2 % (ref 0.0–14.0)
NEUT#: 1.7 10*3/uL (ref 1.5–6.5)
NEUT%: 47.5 % (ref 39.0–75.0)
NRBC: 0 % (ref 0–0)
Platelets: 180 10*3/uL (ref 140–400)
RBC: 4.84 10*6/uL (ref 4.20–5.82)
RDW: 12.6 % (ref 11.0–14.6)
WBC: 3.6 10*3/uL — AB (ref 4.0–10.3)
lymph#: 1.5 10*3/uL (ref 0.9–3.3)

## 2017-07-14 LAB — PROTIME-INR
INR: 3.6 — AB (ref 2.00–3.50)
PROTIME: 43.2 s — AB (ref 10.6–13.4)

## 2017-07-14 NOTE — Telephone Encounter (Signed)
Scheduled appt per 11/26 los - Gave patient AVS and calender per los 

## 2017-07-14 NOTE — Progress Notes (Signed)
[place] [phone]   [phone]  OFFICE PROGRESS NOTE  Harvie Junior, MD [place] [address]  DIAGNOSIS: History of deep venous thrombosis and pulmonary embolism diagnosed in 2006.  PRIOR THERAPY: None.  CURRENT THERAPY: Coumadin 6 mg by mouth daily   INTERVAL HISTORY: Travis Morgan 66 y.o. male returns to the clinic today for follow-up visit.  The patient is feeling fine with no specific complaints.  He is currently on Coumadin 6 mg p.o. daily except Monday and Thursday he takes 8 mg.  He denied having any bleeding issues.  He denied having any chest pain, shortness of breath, cough or hemoptysis.  He denied having any nausea, vomiting, diarrhea or constipation.  He is here today for evaluation and repeat blood work.  MEDICAL HISTORY: Past Medical History:  Diagnosis Date  . DVT (deep venous thrombosis) ([place])   . Stroke [facility])     ALLERGIES:  has No Known Allergies.  MEDICATIONS:  Current Outpatient Medications  Medication Sig Dispense Refill  . acetaminophen (TYLENOL) 500 MG tablet Take 500 mg by mouth every 6 (six) hours as needed for mild pain.     Marland Kitchen gabapentin (NEURONTIN) 300 MG capsule Take 300 mg by mouth daily.     Marland Kitchen HYDROcodone-ibuprofen (VICOPROFEN) 7.5-200 MG tablet Take 1 tablet by mouth every 6 (six) hours as needed for moderate pain.     Marland Kitchen lisinopril-hydrochlorothiazide (PRINZIDE,ZESTORETIC) 20-12.5 MG per tablet Take 1 tablet by mouth daily.    . traMADol (ULTRAM) 50 MG tablet Take 1 tablet (50 mg total) by mouth every 6 (six) hours as needed. 15 tablet 0  . warfarin (COUMADIN) 2 MG tablet TAKE 3 TABLETS BY MOUTH ONCE DAILY 90 tablet 0   No current facility-administered medications for this visit.     SURGICAL HISTORY: No past surgical history on file.  REVIEW OF SYSTEMS:  A comprehensive review of systems was negative.   PHYSICAL EXAMINATION: General appearance: alert, cooperative  and no distress Head: Normocephalic, without obvious abnormality, atraumatic Neck: no adenopathy, no JVD, supple, symmetrical, trachea midline and thyroid not enlarged, symmetric, no tenderness/mass/nodules Lymph nodes: Cervical, supraclavicular, and axillary nodes normal. Resp: clear to auscultation bilaterally Back: symmetric, no curvature. ROM normal. No CVA tenderness. Cardio: regular rate and rhythm, S1, S2 normal, no murmur, click, rub or gallop GI: soft, non-tender; bowel sounds normal; no masses,  no organomegaly Extremities: extremities normal, atraumatic, no cyanosis or edema  ECOG PERFORMANCE STATUS: 0 - Asymptomatic  Blood pressure (!) 169/75, pulse (!) 59, temperature 97.9 F (36.6 C), temperature source Oral, resp. rate 20, height 6' (1.829 m), weight 183 lb 12.8 oz (83.4 kg), SpO2 100 %.  LABORATORY DATA: Lab Results  Component Value Date   WBC 3.6 (L) 07/14/2017   HGB 14.7 07/14/2017   HCT 43.6 07/14/2017   MCV 90.1 07/14/2017   PLT 180 07/14/2017      Chemistry      Component Value Date/Time   NA 141 12/26/2014 0829   K 4.1 12/26/2014 0829   CL 103 12/21/2012 1529   CO2 25 12/26/2014 0829   BUN 12.3 12/26/2014 0829   CREATININE 1.2 12/26/2014 0829      Component Value Date/Time   CALCIUM 9.1 12/26/2014 0829   ALKPHOS 74 12/26/2014 0829   AST 19 12/26/2014 0829   ALT 24 12/26/2014 0829   BILITOT 0.47 12/26/2014 0829       RADIOGRAPHIC STUDIES:  No results found.  ASSESSMENT AND PLAN:  This is a very pleasant 66 years old African-American male with history of deep venous thrombosis as well as pulmonary embolism and 2006 and has been on treatment with chronic anticoagulation with Coumadin since that time. The patient has no complaints today.  He denied having any bleeding issues. His INR today is 3.6. I recommended for the patient to continue his treatment with Coumadin but he will take 6 mg p.o. Daily. I will see him back for follow-up visit in 6  months for evaluation with repeat CBC and PT/INR. He was advised to call immediately if he has any concerning symptoms in the interval. The patient voices understanding of current disease status and treatment options and is in agreement with the current care plan. All questions were answered. The patient knows to call the clinic with any problems, questions or concerns. We can certainly see the patient much sooner if necessary. I spent 10 minutes counseling the patient face to face. The total time spent in the appointment was 15 minutes.  Disclaimer: This note was dictated with voice recognition software. Similar sounding words can inadvertently be transcribed and may not be corrected upon review.

## 2017-08-02 ENCOUNTER — Other Ambulatory Visit: Payer: Self-pay | Admitting: Internal Medicine

## 2017-09-01 ENCOUNTER — Other Ambulatory Visit: Payer: Self-pay | Admitting: Internal Medicine

## 2017-10-03 ENCOUNTER — Other Ambulatory Visit: Payer: Self-pay | Admitting: Internal Medicine

## 2017-11-01 ENCOUNTER — Other Ambulatory Visit: Payer: Self-pay | Admitting: Internal Medicine

## 2017-11-30 ENCOUNTER — Other Ambulatory Visit: Payer: Self-pay | Admitting: Internal Medicine

## 2017-12-05 ENCOUNTER — Telehealth: Payer: Self-pay | Admitting: Internal Medicine

## 2017-12-05 NOTE — Telephone Encounter (Signed)
Called pt re appt being moved to 6/3 due to MM being out of the office - left vm for pt re appts.

## 2017-12-31 ENCOUNTER — Other Ambulatory Visit: Payer: Self-pay | Admitting: Internal Medicine

## 2018-01-13 ENCOUNTER — Other Ambulatory Visit: Payer: Medicare HMO

## 2018-01-13 ENCOUNTER — Ambulatory Visit: Payer: Medicare HMO | Admitting: Internal Medicine

## 2018-01-19 ENCOUNTER — Inpatient Hospital Stay: Payer: Medicare HMO | Attending: Internal Medicine

## 2018-01-19 ENCOUNTER — Encounter: Payer: Self-pay | Admitting: Internal Medicine

## 2018-01-19 ENCOUNTER — Inpatient Hospital Stay (HOSPITAL_BASED_OUTPATIENT_CLINIC_OR_DEPARTMENT_OTHER): Payer: Medicare HMO | Admitting: Internal Medicine

## 2018-01-19 ENCOUNTER — Telehealth: Payer: Self-pay | Admitting: Internal Medicine

## 2018-01-19 VITALS — BP 168/98 | HR 65 | Temp 98.1°F | Resp 18 | Ht 72.0 in | Wt 177.6 lb

## 2018-01-19 DIAGNOSIS — I10 Essential (primary) hypertension: Secondary | ICD-10-CM

## 2018-01-19 DIAGNOSIS — M7989 Other specified soft tissue disorders: Secondary | ICD-10-CM | POA: Insufficient documentation

## 2018-01-19 DIAGNOSIS — Z8673 Personal history of transient ischemic attack (TIA), and cerebral infarction without residual deficits: Secondary | ICD-10-CM | POA: Diagnosis not present

## 2018-01-19 DIAGNOSIS — Z79899 Other long term (current) drug therapy: Secondary | ICD-10-CM | POA: Diagnosis not present

## 2018-01-19 DIAGNOSIS — I2782 Chronic pulmonary embolism: Secondary | ICD-10-CM

## 2018-01-19 DIAGNOSIS — Z86718 Personal history of other venous thrombosis and embolism: Secondary | ICD-10-CM | POA: Diagnosis not present

## 2018-01-19 DIAGNOSIS — Z7901 Long term (current) use of anticoagulants: Secondary | ICD-10-CM

## 2018-01-19 DIAGNOSIS — I824Z2 Acute embolism and thrombosis of unspecified deep veins of left distal lower extremity: Secondary | ICD-10-CM

## 2018-01-19 DIAGNOSIS — Z86711 Personal history of pulmonary embolism: Secondary | ICD-10-CM

## 2018-01-19 LAB — CBC WITH DIFFERENTIAL/PLATELET
BASOS ABS: 0 10*3/uL (ref 0.0–0.1)
BASOS PCT: 1 %
EOS PCT: 3 %
Eosinophils Absolute: 0.1 10*3/uL (ref 0.0–0.5)
HCT: 41 % (ref 38.4–49.9)
Hemoglobin: 13.6 g/dL (ref 13.0–17.1)
LYMPHS PCT: 28 %
Lymphs Abs: 1.2 10*3/uL (ref 0.9–3.3)
MCH: 30.6 pg (ref 27.2–33.4)
MCHC: 33.1 g/dL (ref 32.0–36.0)
MCV: 92.5 fL (ref 79.3–98.0)
Monocytes Absolute: 0.2 10*3/uL (ref 0.1–0.9)
Monocytes Relative: 6 %
NEUTROS ABS: 2.7 10*3/uL (ref 1.5–6.5)
Neutrophils Relative %: 62 %
PLATELETS: 200 10*3/uL (ref 140–400)
RBC: 4.44 MIL/uL (ref 4.20–5.82)
RDW: 12.6 % (ref 11.0–14.6)
WBC: 4.2 10*3/uL (ref 4.0–10.3)

## 2018-01-19 LAB — PROTIME-INR
INR: 2.25
Prothrombin Time: 24.7 seconds — ABNORMAL HIGH (ref 11.4–15.2)

## 2018-01-19 NOTE — Progress Notes (Signed)
[place] Telephone:([phone]   Fax:(336) [phone]  OFFICE PROGRESS NOTE  Harvie Junior, MD [place] [address]  DIAGNOSIS: History of deep venous thrombosis and pulmonary embolism diagnosed in 2006.  PRIOR THERAPY: None.  CURRENT THERAPY: Coumadin 6 mg by mouth daily   INTERVAL HISTORY: Travis Morgan 67 y.o. male returns to the clinic today for six-month follow-up visit.  The patient is feeling fine today with no specific complaints except for cramps and occasional swelling of the left lower extremities.  This has been going on for several years.  He is currently on treatment with Coumadin 6 mg p.o. daily and has been tolerating it fairly well.  He denied having any chest pain, shortness breath, cough or hemoptysis.  He denied having any bleeding, bruises or ecchymosis.  The patient has no nausea, vomiting, diarrhea or constipation.  He is here today for evaluation and repeat CBC and PT/INR.  MEDICAL HISTORY: Past Medical History:  Diagnosis Date  . DVT (deep venous thrombosis) ([place])   . Stroke [facility])     ALLERGIES:  has No Known Allergies.  MEDICATIONS:  Current Outpatient Medications  Medication Sig Dispense Refill  . acetaminophen (TYLENOL) 500 MG tablet Take 500 mg by mouth every 6 (six) hours as needed for mild pain.     Marland Kitchen gabapentin (NEURONTIN) 300 MG capsule Take 300 mg by mouth daily.     Marland Kitchen HYDROcodone-ibuprofen (VICOPROFEN) 7.5-200 MG tablet Take 1 tablet by mouth every 6 (six) hours as needed for moderate pain.     Marland Kitchen lisinopril-hydrochlorothiazide (PRINZIDE,ZESTORETIC) 20-12.5 MG per tablet Take 1 tablet by mouth daily.    . traMADol (ULTRAM) 50 MG tablet Take 1 tablet (50 mg total) by mouth every 6 (six) hours as needed. 15 tablet 0  . warfarin (COUMADIN) 2 MG tablet TAKE 3 TABLETS BY MOUTH ONCE DAILY 90 tablet 0   No current facility-administered medications for this visit.     SURGICAL HISTORY: History  reviewed. No pertinent surgical history.  REVIEW OF SYSTEMS:  A comprehensive review of systems was negative.   PHYSICAL EXAMINATION: General appearance: alert, cooperative and no distress Head: Normocephalic, without obvious abnormality, atraumatic Neck: no adenopathy, no JVD, supple, symmetrical, trachea midline and thyroid not enlarged, symmetric, no tenderness/mass/nodules Lymph nodes: Cervical, supraclavicular, and axillary nodes normal. Resp: clear to auscultation bilaterally Back: symmetric, no curvature. ROM normal. No CVA tenderness. Cardio: regular rate and rhythm, S1, S2 normal, no murmur, click, rub or gallop GI: soft, non-tender; bowel sounds normal; no masses,  no organomegaly Extremities: extremities normal, atraumatic, no cyanosis or edema  ECOG PERFORMANCE STATUS: 1 - Symptomatic but completely ambulatory  Blood pressure (!) 168/98, pulse 65, temperature 98.1 F (36.7 C), temperature source Oral, resp. rate 18, height 6' (1.829 m), weight 177 lb 9.6 oz (80.6 kg), SpO2 100 %.  LABORATORY DATA: Lab Results  Component Value Date   WBC 4.2 01/19/2018   HGB 13.6 01/19/2018   HCT 41.0 01/19/2018   MCV 92.5 01/19/2018   PLT 200 01/19/2018      Chemistry      Component Value Date/Time   NA 141 12/26/2014 0829   K 4.1 12/26/2014 0829   CL 103 12/21/2012 1529   CO2 25 12/26/2014 0829   BUN 12.3 12/26/2014 0829   CREATININE 1.2 12/26/2014 0829      Component Value Date/Time   CALCIUM 9.1 12/26/2014 0829   ALKPHOS 74 12/26/2014 0829  AST 19 12/26/2014 0829   ALT 24 12/26/2014 0829   BILITOT 0.47 12/26/2014 0829       RADIOGRAPHIC STUDIES: No results found.  ASSESSMENT AND PLAN:  This is a very pleasant 67 years old African-American male with history of deep venous thrombosis as well as pulmonary embolism and 2006 and has been on treatment with chronic anticoagulation with Coumadin since that time. His Coumadin dose is currently 6 mg p.o. daily.  He has been  tolerating it well with no significant adverse effects. CBC, PT/INR are within the acceptable range today. I recommended for the patient to continue his current treatment with Coumadin 6 mg p.o. daily. For hypertension I strongly advised him to monitor his blood pressure closely and to consult with his primary care physician for adjustment of his medication. The patient was advised to call immediately if he has any concerning symptoms in the interval. The patient voices understanding of current disease status and treatment options and is in agreement with the current care plan. All questions were answered. The patient knows to call the clinic with any problems, questions or concerns. We can certainly see the patient much sooner if necessary. I spent 10 minutes counseling the patient face to face. The total time spent in the appointment was 15 minutes.  Disclaimer: This note was dictated with voice recognition software. Similar sounding words can inadvertently be transcribed and may not be corrected upon review.

## 2018-01-19 NOTE — Telephone Encounter (Signed)
Appointments scheduled AVS/Calendar printed per 6/3 los °

## 2018-01-31 ENCOUNTER — Other Ambulatory Visit: Payer: Self-pay | Admitting: Internal Medicine

## 2018-02-11 ENCOUNTER — Other Ambulatory Visit: Payer: Self-pay

## 2018-02-11 ENCOUNTER — Encounter: Payer: Self-pay | Admitting: Emergency Medicine

## 2018-02-11 ENCOUNTER — Ambulatory Visit (INDEPENDENT_AMBULATORY_CARE_PROVIDER_SITE_OTHER): Payer: Medicare HMO | Admitting: Emergency Medicine

## 2018-02-11 VITALS — BP 120/76 | HR 62 | Temp 97.9°F | Resp 16 | Ht 72.75 in | Wt 177.8 lb

## 2018-02-11 DIAGNOSIS — I10 Essential (primary) hypertension: Secondary | ICD-10-CM

## 2018-02-11 DIAGNOSIS — Z7901 Long term (current) use of anticoagulants: Secondary | ICD-10-CM

## 2018-02-11 DIAGNOSIS — G894 Chronic pain syndrome: Secondary | ICD-10-CM | POA: Diagnosis not present

## 2018-02-11 DIAGNOSIS — Z8673 Personal history of transient ischemic attack (TIA), and cerebral infarction without residual deficits: Secondary | ICD-10-CM | POA: Insufficient documentation

## 2018-02-11 MED ORDER — LISINOPRIL-HYDROCHLOROTHIAZIDE 20-12.5 MG PO TABS: 1.0000 | ORAL_TABLET | Freq: Every day | ORAL | 1 refills | Status: AC

## 2018-02-11 NOTE — Patient Instructions (Addendum)
IF you received an x-ray today, you will receive an invoice from [facility]. Please contact [facility] at [phone] with questions or concerns regarding your invoice.   IF you received labwork today, you will receive an invoice from [facility]. Please contact [facility] at [phone] with questions or concerns regarding your invoice.   Our billing staff will not be able to assist you with questions regarding bills from these companies.  You will be contacted with the lab results as soon as they are available. The fastest way to get your results is to activate your My Chart account. Instructions are located on the last page of this paperwork. If you have not heard from [place] regarding the results in 2 weeks, please contact this office.     Chronic Pain, Adult Chronic pain is a type of pain that lasts or keeps coming back (recurs) for at least six months. You may have chronic headaches, abdominal pain, or body pain. Chronic pain may be related to an illness, such as fibromyalgia or complex regional pain syndrome. Sometimes the cause of chronic pain is not known. Chronic pain can make it hard for you to do daily activities. If not treated, chronic pain can lead to other health problems, including anxiety and depression. Treatment depends on the cause and severity of your pain. You may need to work with a pain specialist to come up with a treatment plan. The plan may include medicine, counseling, and physical therapy. Many people benefit from a combination of two or more types of treatment to control their pain. Follow these instructions at home: Lifestyle  Consider keeping a pain diary to share with your health care providers.  Consider talking with a mental health care provider (psychologist) about how to cope with chronic pain.  Consider joining a chronic pain support group.  Try to control or lower your stress levels. Talk to your health care provider about strategies  to do this. General instructions   Take over-the-counter and prescription medicines only as told by your health care provider.  Follow your treatment plan as told by your health care provider. This may include: ? Gentle, regular exercise. ? Eating a healthy diet that includes foods such as vegetables, fruits, fish, and lean meats. ? Cognitive or behavioral therapy. ? Working with a Community education officer. ? Meditation or yoga. ? Acupuncture or massage therapy. ? Aroma, color, light, or sound therapy. ? Local electrical stimulation. ? Shots (injections) of numbing or pain-relieving medicines into the spine or the area of pain.  Check your pain level as told by your health care provider. Ask your health care provider if you should use a pain scale.  Learn as much as you can about how to manage your chronic pain. Ask your health care provider if an intensive pain rehabilitation program or a chronic pain specialist would be helpful.  Keep all follow-up visits as told by your health care provider. This is important. Contact a health care provider if:  Your pain gets worse.  You have new pain.  You have trouble sleeping.  You have trouble doing your normal activities.  Your pain is not controlled with treatment.  Your have side effects from pain medicine.  You feel weak. Get help right away if:  You lose feeling or have numbness in your body.  You lose control of bowel or bladder function.  Your pain suddenly gets much worse.  You develop shaking or chills.  You develop confusion.  You develop  chest pain.  You have trouble breathing or shortness of breath.  You pass out.  You have thoughts about hurting yourself or others. This information is not intended to replace advice given to you by your health care provider. Make sure you discuss any questions you have with your health care provider. Document Released: 04/27/2002 Document Revised: 04/04/2016 Document Reviewed:  01/23/2016 Elsevier Interactive Patient Education  2018 [employer].  Hypertension Hypertension, commonly called high blood pressure, is when the force of blood pumping through the arteries is too strong. The arteries are the blood vessels that carry blood from the heart throughout the body. Hypertension forces the heart to work harder to pump blood and may cause arteries to become narrow or stiff. Having untreated or uncontrolled hypertension can cause heart attacks, strokes, kidney disease, and other problems. A blood pressure reading consists of a higher number over a lower number. Ideally, your blood pressure should be below 120/80. The first ("top") number is called the systolic pressure. It is a measure of the pressure in your arteries as your heart beats. The second ("bottom") number is called the diastolic pressure. It is a measure of the pressure in your arteries as the heart relaxes. What are the causes? The cause of this condition is not known. What increases the risk? Some risk factors for high blood pressure are under your control. Others are not. Factors you can change  Smoking.  Having type 2 diabetes mellitus, high cholesterol, or both.  Not getting enough exercise or physical activity.  Being overweight.  Having too much fat, sugar, calories, or salt (sodium) in your diet.  Drinking too much alcohol. Factors that are difficult or impossible to change  Having chronic kidney disease.  Having a family history of high blood pressure.  Age. Risk increases with age.  Race. You may be at higher risk if you are African-American.  Gender. Men are at higher risk than women before age 76. After age 23, women are at higher risk than men.  Having obstructive sleep apnea.  Stress. What are the signs or symptoms? Extremely high blood pressure (hypertensive crisis) may cause:  Headache.  Anxiety.  Shortness of breath.  Nosebleed.  Nausea and vomiting.  Severe  chest pain.  Jerky movements you cannot control (seizures).  How is this diagnosed? This condition is diagnosed by measuring your blood pressure while you are seated, with your arm resting on a surface. The cuff of the blood pressure monitor will be placed directly against the skin of your upper arm at the level of your heart. It should be measured at least twice using the same arm. Certain conditions can cause a difference in blood pressure between your right and left arms. Certain factors can cause blood pressure readings to be lower or higher than normal (elevated) for a short period of time:  When your blood pressure is higher when you are in a health care provider's office than when you are at home, this is called white coat hypertension. Most people with this condition do not need medicines.  When your blood pressure is higher at home than when you are in a health care provider's office, this is called masked hypertension. Most people with this condition may need medicines to control blood pressure.  If you have a high blood pressure reading during one visit or you have normal blood pressure with other risk factors:  You may be asked to return on a different day to have your blood pressure checked  again.  You may be asked to monitor your blood pressure at home for 1 week or longer.  If you are diagnosed with hypertension, you may have other blood or imaging tests to help your health care provider understand your overall risk for other conditions. How is this treated? This condition is treated by making healthy lifestyle changes, such as eating healthy foods, exercising more, and reducing your alcohol intake. Your health care provider may prescribe medicine if lifestyle changes are not enough to get your blood pressure under control, and if:  Your systolic blood pressure is above 130.  Your diastolic blood pressure is above 80.  Your personal target blood pressure may vary depending on  your medical conditions, your age, and other factors. Follow these instructions at home: Eating and drinking  Eat a diet that is high in fiber and potassium, and low in sodium, added sugar, and fat. An example eating plan is called the DASH (Dietary Approaches to Stop Hypertension) diet. To eat this way: ? Eat plenty of fresh fruits and vegetables. Try to fill half of your plate at each meal with fruits and vegetables. ? Eat whole grains, such as whole wheat pasta, brown rice, or whole grain bread. Fill about one quarter of your plate with whole grains. ? Eat or drink low-fat dairy products, such as skim milk or low-fat yogurt. ? Avoid fatty cuts of meat, processed or cured meats, and poultry with skin. Fill about one quarter of your plate with lean proteins, such as fish, chicken without skin, beans, eggs, and tofu. ? Avoid premade and processed foods. These tend to be higher in sodium, added sugar, and fat.  Reduce your daily sodium intake. Most people with hypertension should eat less than 1,500 mg of sodium a day.  Limit alcohol intake to no more than 1 drink a day for nonpregnant women and 2 drinks a day for men. One drink equals 12 oz of beer, 5 oz of wine, or 1 oz of hard liquor. Lifestyle  Work with your health care provider to maintain a healthy body weight or to lose weight. Ask what an ideal weight is for you.  Get at least 30 minutes of exercise that causes your heart to beat faster (aerobic exercise) most days of the week. Activities may include walking, swimming, or biking.  Include exercise to strengthen your muscles (resistance exercise), such as pilates or lifting weights, as part of your weekly exercise routine. Try to do these types of exercises for 30 minutes at least 3 days a week.  Do not use any products that contain nicotine or tobacco, such as cigarettes and e-cigarettes. If you need help quitting, ask your health care provider.  Monitor your blood pressure at home  as told by your health care provider.  Keep all follow-up visits as told by your health care provider. This is important. Medicines  Take over-the-counter and prescription medicines only as told by your health care provider. Follow directions carefully. Blood pressure medicines must be taken as prescribed.  Do not skip doses of blood pressure medicine. Doing this puts you at risk for problems and can make the medicine less effective.  Ask your health care provider about side effects or reactions to medicines that you should watch for. Contact a health care provider if:  You think you are having a reaction to a medicine you are taking.  You have headaches that keep coming back (recurring).  You feel dizzy.  You have swelling in your  ankles.  You have trouble with your vision. Get help right away if:  You develop a severe headache or confusion.  You have unusual weakness or numbness.  You feel faint.  You have severe pain in your chest or abdomen.  You vomit repeatedly.  You have trouble breathing. Summary  Hypertension is when the force of blood pumping through your arteries is too strong. If this condition is not controlled, it may put you at risk for serious complications.  Your personal target blood pressure may vary depending on your medical conditions, your age, and other factors. For most people, a normal blood pressure is less than 120/80.  Hypertension is treated with lifestyle changes, medicines, or a combination of both. Lifestyle changes include weight loss, eating a healthy, low-sodium diet, exercising more, and limiting alcohol. This information is not intended to replace advice given to you by your health care provider. Make sure you discuss any questions you have with your health care provider. Document Released: 08/05/2005 Document Revised: 07/03/2016 Document Reviewed: 07/03/2016 Elsevier Interactive Patient Education  [employer].

## 2018-02-11 NOTE — Progress Notes (Signed)
Travis Morgan 67 y.o.   Chief Complaint  Patient presents with  . Establish Care  . Medication Refill    gabapentin, hydrocodone-ibuprofen and lisinopril-hctz, has been without medication x 1 year    HISTORY OF PRESENT ILLNESS: This is a 67 y.o. male with a history of hypertension, history of a stroke, on long-term anticoagulation, chronic DVTs, chronic pain, off pain medication and blood pressure medication for year.  First visit to this office.  HPI   Prior to Admission medications   Medication Sig Start Date End Date Taking? Authorizing Provider  acetaminophen (TYLENOL) 500 MG tablet Take 500 mg by mouth every 6 (six) hours as needed for mild pain.    Yes [provider]  warfarin (COUMADIN) 2 MG tablet TAKE 3 TABLETS BY MOUTH ONCE DAILY 02/01/18  Yes Curt Bears, MD  gabapentin (NEURONTIN) 300 MG capsule Take 300 mg by mouth daily.  01/26/16   [provider]  HYDROcodone-ibuprofen (VICOPROFEN) 7.5-200 MG tablet Take 1 tablet by mouth every 6 (six) hours as needed for moderate pain.  01/26/16   [provider]  lisinopril-hydrochlorothiazide (PRINZIDE,ZESTORETIC) 20-12.5 MG per tablet Take 1 tablet by mouth daily. 12/02/12   [provider]  traMADol (ULTRAM) 50 MG tablet Take 1 tablet (50 mg total) by mouth every 6 (six) hours as needed. Patient not taking: Reported on 02/11/2018 05/05/16   Leonard Schwartz, MD    No Known Allergies  Patient Active Problem List   Diagnosis Date Noted  . Anticoagulation monitoring, INR range 2-3 03/11/2016  . Chronic deep vein thrombosis (DVT) of left lower extremity (Travis Morgan) 10/18/2015  . 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 Hypertension 06/02/2012  . Pulmonary embolism ([place]) 08/23/2011  . Left leg DVT ([place]) 08/23/2011    Past Medical History:  Diagnosis Date  . DVT (deep venous thrombosis) ([place])   . Stroke [facility])     No past surgical history on file.  Social History    Socioeconomic History  . Marital status: Married    Spouse name: Not on file  . Number of children: Not on file  . Years of education: Not on file  . Highest education level: Not on file  Occupational History  . Not on file  Social Needs  . Financial resource strain: Not on file  . Food insecurity:    Worry: Not on file    Inability: Not on file  . Transportation needs:    Medical: Not on file    Non-medical: Not on file  Tobacco Use  . Smoking status: Former Smoker    Last attempt to quit: 03/15/2005    Years since quitting: 12.9  . Smokeless tobacco: Never Used  Substance and Sexual Activity  . Alcohol use: Yes    Alcohol/week: 0.6 oz    Types: 1 Cans of beer per week  . Drug use: No  . Sexual activity: Not Currently  Lifestyle  . Physical activity:    Days per week: Not on file    Minutes per session: Not on file  . Stress: Not on file  Relationships  . Social connections:    Talks on phone: Not on file    Gets together: Not on file    Attends religious service: Not on file    Active member of club or organization: Not on file    Attends meetings of clubs or organizations: Not on file    Relationship status: Not on file  . Intimate partner  violence:    Fear of current or ex partner: Not on file    Emotionally abused: Not on file    Physically abused: Not on file    Forced sexual activity: Not on file  Other Topics Concern  . Not on file  Social History Narrative  . Not on file    No family history on file.   Review of Systems  Constitutional: Negative.  Negative for chills and fever.  HENT: Negative.   Eyes: Negative.  Negative for blurred vision and double vision.  Respiratory: Negative.  Negative for cough and shortness of breath.   Cardiovascular: Negative.  Negative for chest pain and palpitations.  Gastrointestinal: Negative.  Negative for abdominal pain, blood in stool, diarrhea, nausea and vomiting.  Genitourinary: Negative.  Negative for  dysuria and hematuria.  Skin: Negative.  Negative for rash.  Neurological: Negative.  Negative for dizziness and headaches.  Endo/Heme/Allergies: Negative.  Does not bruise/bleed easily.  All other systems reviewed and are negative.     Vitals:   02/11/18 1015  BP: 120/76  Pulse: 62  Resp: 16  Temp: 97.9 F (36.6 C)  SpO2: 99%     Physical Exam  Constitutional: He is oriented to person, place, and time. He appears well-developed and well-nourished.  HENT:  Head: Normocephalic and atraumatic.  Nose: Nose normal.  Mouth/Throat: Oropharynx is clear and moist.  Eyes: Pupils are equal, round, and reactive to light. EOM are normal.  Neck: Normal range of motion. Neck supple.  Cardiovascular: Normal rate, regular rhythm and normal heart sounds.  Pulmonary/Chest: Effort normal and breath sounds normal.  Abdominal: Soft. There is no tenderness.  Musculoskeletal: Normal range of motion.  Neurological: He is alert and oriented to person, place, and time.  Skin: Skin is warm and dry. Capillary refill takes less than 2 seconds.  Psychiatric: He has a normal mood and affect. His behavior is normal.  Vitals reviewed.  A total of 30 minutes was spent in the room with the patient, greater than 50% of which was in counseling/coordination of care regarding chronic medical problems, medications, need for referral to pain management clinic and need for follow-up.   ASSESSMENT & PLAN: [name] was seen today for establish care and medication refill.  Diagnoses and all orders for this visit:  Essential hypertension -     lisinopril-hydrochlorothiazide (PRINZIDE,ZESTORETIC) 20-12.5 MG tablet; Take 1 tablet by mouth daily.  Chronic pain syndrome -     Ambulatory referral to Pain Clinic  History of stroke  Current use of long term anticoagulation    Patient Instructions       IF you received an x-ray today, you will receive an invoice from [facility]. Please contact  [facility] at [phone] with questions or concerns regarding your invoice.   IF you received labwork today, you will receive an invoice from [place]. Please contact [facility] at [phone] with questions or concerns regarding your invoice.   Our billing staff will not be able to assist you with questions regarding bills from these companies.  You will be contacted with the lab results as soon as they are available. The fastest way to get your results is to activate your My Chart account. Instructions are located on the last page of this paperwork. If you have not heard from [place] regarding the results in 2 weeks, please contact this office.     Chronic Pain, Adult Chronic pain is a type of pain that lasts or keeps coming back (recurs)  for at least six months. You may have chronic headaches, abdominal pain, or body pain. Chronic pain may be related to an illness, such as fibromyalgia or complex regional pain syndrome. Sometimes the cause of chronic pain is not known. Chronic pain can make it hard for you to do daily activities. If not treated, chronic pain can lead to other health problems, including anxiety and depression. Treatment depends on the cause and severity of your pain. You may need to work with a pain specialist to come up with a treatment plan. The plan may include medicine, counseling, and physical therapy. Many people benefit from a combination of two or more types of treatment to control their pain. Follow these instructions at home: Lifestyle  Consider keeping a pain diary to share with your health care providers.  Consider talking with a mental health care provider (psychologist) about how to cope with chronic pain.  Consider joining a chronic pain support group.  Try to control or lower your stress levels. Talk to your health care provider about strategies to do this. General instructions   Take over-the-counter and prescription medicines only as told by your  health care provider.  Follow your treatment plan as told by your health care provider. This may include: ? Gentle, regular exercise. ? Eating a healthy diet that includes foods such as vegetables, fruits, fish, and lean meats. ? Cognitive or behavioral therapy. ? Working with a Community education officer. ? Meditation or yoga. ? Acupuncture or massage therapy. ? Aroma, color, light, or sound therapy. ? Local electrical stimulation. ? Shots (injections) of numbing or pain-relieving medicines into the spine or the area of pain.  Check your pain level as told by your health care provider. Ask your health care provider if you should use a pain scale.  Learn as much as you can about how to manage your chronic pain. Ask your health care provider if an intensive pain rehabilitation program or a chronic pain specialist would be helpful.  Keep all follow-up visits as told by your health care provider. This is important. Contact a health care provider if:  Your pain gets worse.  You have new pain.  You have trouble sleeping.  You have trouble doing your normal activities.  Your pain is not controlled with treatment.  Your have side effects from pain medicine.  You feel weak. Get help right away if:  You lose feeling or have numbness in your body.  You lose control of bowel or bladder function.  Your pain suddenly gets much worse.  You develop shaking or chills.  You develop confusion.  You develop chest pain.  You have trouble breathing or shortness of breath.  You pass out.  You have thoughts about hurting yourself or others. This information is not intended to replace advice given to you by your health care provider. Make sure you discuss any questions you have with your health care provider. Document Released: 04/27/2002 Document Revised: 04/04/2016 Document Reviewed: 01/23/2016 Elsevier Interactive Patient Education  2018 [employer].  Hypertension Hypertension, commonly  called high blood pressure, is when the force of blood pumping through the arteries is too strong. The arteries are the blood vessels that carry blood from the heart throughout the body. Hypertension forces the heart to work harder to pump blood and may cause arteries to become narrow or stiff. Having untreated or uncontrolled hypertension can cause heart attacks, strokes, kidney disease, and other problems. A blood pressure reading consists of a higher number over a  lower number. Ideally, your blood pressure should be below 120/80. The first ("top") number is called the systolic pressure. It is a measure of the pressure in your arteries as your heart beats. The second ("bottom") number is called the diastolic pressure. It is a measure of the pressure in your arteries as the heart relaxes. What are the causes? The cause of this condition is not known. What increases the risk? Some risk factors for high blood pressure are under your control. Others are not. Factors you can change  Smoking.  Having type 2 diabetes mellitus, high cholesterol, or both.  Not getting enough exercise or physical activity.  Being overweight.  Having too much fat, sugar, calories, or salt (sodium) in your diet.  Drinking too much alcohol. Factors that are difficult or impossible to change  Having chronic kidney disease.  Having a family history of high blood pressure.  Age. Risk increases with age.  Race. You may be at higher risk if you are African-American.  Gender. Men are at higher risk than women before age 37. After age 2, women are at higher risk than men.  Having obstructive sleep apnea.  Stress. What are the signs or symptoms? Extremely high blood pressure (hypertensive crisis) may cause:  Headache.  Anxiety.  Shortness of breath.  Nosebleed.  Nausea and vomiting.  Severe chest pain.  Jerky movements you cannot control (seizures).  How is this diagnosed? This condition is  diagnosed by measuring your blood pressure while you are seated, with your arm resting on a surface. The cuff of the blood pressure monitor will be placed directly against the skin of your upper arm at the level of your heart. It should be measured at least twice using the same arm. Certain conditions can cause a difference in blood pressure between your right and left arms. Certain factors can cause blood pressure readings to be lower or higher than normal (elevated) for a short period of time:  When your blood pressure is higher when you are in a health care provider's office than when you are at home, this is called white coat hypertension. Most people with this condition do not need medicines.  When your blood pressure is higher at home than when you are in a health care provider's office, this is called masked hypertension. Most people with this condition may need medicines to control blood pressure.  If you have a high blood pressure reading during one visit or you have normal blood pressure with other risk factors:  You may be asked to return on a different day to have your blood pressure checked again.  You may be asked to monitor your blood pressure at home for 1 week or longer.  If you are diagnosed with hypertension, you may have other blood or imaging tests to help your health care provider understand your overall risk for other conditions. How is this treated? This condition is treated by making healthy lifestyle changes, such as eating healthy foods, exercising more, and reducing your alcohol intake. Your health care provider may prescribe medicine if lifestyle changes are not enough to get your blood pressure under control, and if:  Your systolic blood pressure is above 130.  Your diastolic blood pressure is above 80.  Your personal target blood pressure may vary depending on your medical conditions, your age, and other factors. Follow these instructions at home: Eating and  drinking  Eat a diet that is high in fiber and potassium, and low in sodium, added  sugar, and fat. An example eating plan is called the DASH (Dietary Approaches to Stop Hypertension) diet. To eat this way: ? Eat plenty of fresh fruits and vegetables. Try to fill half of your plate at each meal with fruits and vegetables. ? Eat whole grains, such as whole wheat pasta, brown rice, or whole grain bread. Fill about one quarter of your plate with whole grains. ? Eat or drink low-fat dairy products, such as skim milk or low-fat yogurt. ? Avoid fatty cuts of meat, processed or cured meats, and poultry with skin. Fill about one quarter of your plate with lean proteins, such as fish, chicken without skin, beans, eggs, and tofu. ? Avoid premade and processed foods. These tend to be higher in sodium, added sugar, and fat.  Reduce your daily sodium intake. Most people with hypertension should eat less than 1,500 mg of sodium a day.  Limit alcohol intake to no more than 1 drink a day for nonpregnant women and 2 drinks a day for men. One drink equals 12 oz of beer, 5 oz of wine, or 1 oz of hard liquor. Lifestyle  Work with your health care provider to maintain a healthy body weight or to lose weight. Ask what an ideal weight is for you.  Get at least 30 minutes of exercise that causes your heart to beat faster (aerobic exercise) most days of the week. Activities may include walking, swimming, or biking.  Include exercise to strengthen your muscles (resistance exercise), such as pilates or lifting weights, as part of your weekly exercise routine. Try to do these types of exercises for 30 minutes at least 3 days a week.  Do not use any products that contain nicotine or tobacco, such as cigarettes and e-cigarettes. If you need help quitting, ask your health care provider.  Monitor your blood pressure at home as told by your health care provider.  Keep all follow-up visits as told by your health care  provider. This is important. Medicines  Take over-the-counter and prescription medicines only as told by your health care provider. Follow directions carefully. Blood pressure medicines must be taken as prescribed.  Do not skip doses of blood pressure medicine. Doing this puts you at risk for problems and can make the medicine less effective.  Ask your health care provider about side effects or reactions to medicines that you should watch for. Contact a health care provider if:  You think you are having a reaction to a medicine you are taking.  You have headaches that keep coming back (recurring).  You feel dizzy.  You have swelling in your ankles.  You have trouble with your vision. Get help right away if:  You develop a severe headache or confusion.  You have unusual weakness or numbness.  You feel faint.  You have severe pain in your chest or abdomen.  You vomit repeatedly.  You have trouble breathing. Summary  Hypertension is when the force of blood pumping through your arteries is too strong. If this condition is not controlled, it may put you at risk for serious complications.  Your personal target blood pressure may vary depending on your medical conditions, your age, and other factors. For most people, a normal blood pressure is less than 120/80.  Hypertension is treated with lifestyle changes, medicines, or a combination of both. Lifestyle changes include weight loss, eating a healthy, low-sodium diet, exercising more, and limiting alcohol. This information is not intended to replace advice given to you by your  health care provider. Make sure you discuss any questions you have with your health care provider. Document Released: 08/05/2005 Document Revised: 07/03/2016 Document Reviewed: 07/03/2016 Elsevier Interactive Patient Education  2018 Elsevier Inc.      Agustina Caroli, MD [facility]

## 2018-03-02 ENCOUNTER — Other Ambulatory Visit: Payer: Self-pay | Admitting: Internal Medicine

## 2018-04-01 ENCOUNTER — Other Ambulatory Visit: Payer: Self-pay | Admitting: Internal Medicine

## 2018-05-04 ENCOUNTER — Other Ambulatory Visit: Payer: Self-pay | Admitting: Internal Medicine

## 2018-06-03 ENCOUNTER — Other Ambulatory Visit: Payer: Self-pay | Admitting: Internal Medicine

## 2018-07-03 ENCOUNTER — Other Ambulatory Visit: Payer: Self-pay | Admitting: Internal Medicine

## 2018-07-21 ENCOUNTER — Encounter: Payer: Self-pay | Admitting: Internal Medicine

## 2018-07-21 ENCOUNTER — Telehealth: Payer: Self-pay | Admitting: Internal Medicine

## 2018-07-21 ENCOUNTER — Inpatient Hospital Stay: Payer: Medicare HMO | Attending: Internal Medicine

## 2018-07-21 ENCOUNTER — Inpatient Hospital Stay (HOSPITAL_BASED_OUTPATIENT_CLINIC_OR_DEPARTMENT_OTHER): Payer: Medicare HMO | Admitting: Internal Medicine

## 2018-07-21 VITALS — BP 134/76 | HR 67 | Temp 98.6°F | Resp 20 | Ht 72.0 in | Wt 178.5 lb

## 2018-07-21 DIAGNOSIS — I824Z2 Acute embolism and thrombosis of unspecified deep veins of left distal lower extremity: Secondary | ICD-10-CM

## 2018-07-21 DIAGNOSIS — I10 Essential (primary) hypertension: Secondary | ICD-10-CM

## 2018-07-21 DIAGNOSIS — Z7901 Long term (current) use of anticoagulants: Secondary | ICD-10-CM | POA: Diagnosis not present

## 2018-07-21 DIAGNOSIS — Z86711 Personal history of pulmonary embolism: Secondary | ICD-10-CM | POA: Diagnosis present

## 2018-07-21 DIAGNOSIS — Z8673 Personal history of transient ischemic attack (TIA), and cerebral infarction without residual deficits: Secondary | ICD-10-CM | POA: Diagnosis not present

## 2018-07-21 DIAGNOSIS — I825Z2 Chronic embolism and thrombosis of unspecified deep veins of left distal lower extremity: Secondary | ICD-10-CM

## 2018-07-21 DIAGNOSIS — Z86718 Personal history of other venous thrombosis and embolism: Secondary | ICD-10-CM | POA: Insufficient documentation

## 2018-07-21 DIAGNOSIS — Z79899 Other long term (current) drug therapy: Secondary | ICD-10-CM | POA: Diagnosis not present

## 2018-07-21 LAB — CBC WITH DIFFERENTIAL (CANCER CENTER ONLY)
Abs Immature Granulocytes: 0.01 10*3/uL (ref 0.00–0.07)
Basophils Absolute: 0 10*3/uL (ref 0.0–0.1)
Basophils Relative: 1 %
EOS ABS: 0.1 10*3/uL (ref 0.0–0.5)
EOS PCT: 2 %
HEMATOCRIT: 41.7 % (ref 39.0–52.0)
Hemoglobin: 13.7 g/dL (ref 13.0–17.0)
Immature Granulocytes: 0 %
Lymphocytes Relative: 24 %
Lymphs Abs: 1.2 10*3/uL (ref 0.7–4.0)
MCH: 30.4 pg (ref 26.0–34.0)
MCHC: 32.9 g/dL (ref 30.0–36.0)
MCV: 92.7 fL (ref 80.0–100.0)
MONO ABS: 0.4 10*3/uL (ref 0.1–1.0)
Monocytes Relative: 8 %
Neutro Abs: 3.3 10*3/uL (ref 1.7–7.7)
Neutrophils Relative %: 65 %
Platelet Count: 187 10*3/uL (ref 150–400)
RBC: 4.5 MIL/uL (ref 4.22–5.81)
RDW: 11.7 % (ref 11.5–15.5)
WBC: 5.1 10*3/uL (ref 4.0–10.5)
nRBC: 0 % (ref 0.0–0.2)

## 2018-07-21 LAB — PROTIME-INR
INR: 1.96
PROTHROMBIN TIME: 22.1 s — AB (ref 11.4–15.2)

## 2018-07-21 NOTE — Progress Notes (Signed)
[place] [phone]   [phone]  OFFICE PROGRESS NOTE  Harvie Junior, MD [place] [id number]  DIAGNOSIS: History of deep venous thrombosis and pulmonary embolism diagnosed in 2006.  PRIOR THERAPY: None.  CURRENT THERAPY: Coumadin 6 mg by mouth daily   INTERVAL HISTORY: Travis Morgan 67 y.o. male returns to the clinic today for 37-month follow-up visit.  The patient is feeling fine today with no concerning complaints.  He denied having any bleeding, bruises or ecchymosis.  He denied having any chest pain, shortness of breath, cough or hemoptysis.  He has no nausea, vomiting, diarrhea or constipation.  He continues to tolerate his current treatment with Coumadin fairly well.  He is here today for evaluation with repeat CBC, PT/INR.  MEDICAL HISTORY: Past Medical History:  Diagnosis Date  . DVT (deep venous thrombosis) ([place])   . Stroke [facility])     ALLERGIES:  has No Known Allergies.  MEDICATIONS:  Current Outpatient Medications  Medication Sig Dispense Refill  . acetaminophen (TYLENOL) 500 MG tablet Take 500 mg by mouth every 6 (six) hours as needed for mild pain.     Marland Kitchen gabapentin (NEURONTIN) 300 MG capsule Take 300 mg by mouth daily.     Marland Kitchen HYDROcodone-ibuprofen (VICOPROFEN) 7.5-200 MG tablet Take 1 tablet by mouth every 6 (six) hours as needed for moderate pain.     Marland Kitchen lisinopril-hydrochlorothiazide (PRINZIDE,ZESTORETIC) 20-12.5 MG tablet Take 1 tablet by mouth daily. 90 tablet 1  . traMADol (ULTRAM) 50 MG tablet Take 1 tablet (50 mg total) by mouth every 6 (six) hours as needed. (Patient not taking: Reported on 02/11/2018) 15 tablet 0  . warfarin (COUMADIN) 2 MG tablet TAKE 3 TABLETS BY MOUTH ONCE DAILY 90 tablet 0   No current facility-administered medications for this visit.     SURGICAL HISTORY: No past surgical history on file.  REVIEW OF SYSTEMS:  A comprehensive review of systems was negative.    PHYSICAL EXAMINATION: General appearance: alert, cooperative and no distress Head: Normocephalic, without obvious abnormality, atraumatic Neck: no adenopathy, no JVD, supple, symmetrical, trachea midline and thyroid not enlarged, symmetric, no tenderness/mass/nodules Lymph nodes: Cervical, supraclavicular, and axillary nodes normal. Resp: clear to auscultation bilaterally Back: symmetric, no curvature. ROM normal. No CVA tenderness. Cardio: regular rate and rhythm, S1, S2 normal, no murmur, click, rub or gallop GI: soft, non-tender; bowel sounds normal; no masses,  no organomegaly Extremities: extremities normal, atraumatic, no cyanosis or edema  ECOG PERFORMANCE STATUS: 1 - Symptomatic but completely ambulatory  Blood pressure 134/76, pulse 67, temperature 98.6 F (37 C), temperature source Oral, resp. rate 20, height 6' (1.829 m), weight 178 lb 8 oz (81 kg), SpO2 100 %.  LABORATORY DATA: Lab Results  Component Value Date   WBC 5.1 07/21/2018   HGB 13.7 07/21/2018   HCT 41.7 07/21/2018   MCV 92.7 07/21/2018   PLT 187 07/21/2018      Chemistry      Component Value Date/Time   NA 141 12/26/2014 0829   K 4.1 12/26/2014 0829   CL 103 12/21/2012 1529   CO2 25 12/26/2014 0829   BUN 12.3 12/26/2014 0829   CREATININE 1.2 12/26/2014 0829      Component Value Date/Time   CALCIUM 9.1 12/26/2014 0829   ALKPHOS 74 12/26/2014 0829   AST 19 12/26/2014 0829   ALT 24 12/26/2014 0829   BILITOT 0.47 12/26/2014 0829  RADIOGRAPHIC STUDIES: No results found.  ASSESSMENT AND PLAN:  This is a very pleasant 67 years old African-American male with history of deep venous thrombosis as well as pulmonary embolism and 2006 and has been on treatment with chronic anticoagulation with Coumadin since that time. His Coumadin dose is currently 6 mg p.o. daily.   The patient continues to tolerate this treatment well and his PT/INR are within the acceptable range. I recommended for him to  continue his current treatment with Coumadin with the same dose. I will see him back for follow-up visit in 4 months for evaluation with repeat CBC, PT/INR. He was advised to call immediately if he has any concerning symptoms in the interval. The patient voices understanding of current disease status and treatment options and is in agreement with the current care plan. All questions were answered. The patient knows to call the clinic with any problems, questions or concerns. We can certainly see the patient much sooner if necessary. I spent 10 minutes counseling the patient face to face. The total time spent in the appointment was 15 minutes.  Disclaimer: This note was dictated with voice recognition software. Similar sounding words can inadvertently be transcribed and may not be corrected upon review.

## 2018-07-21 NOTE — Telephone Encounter (Signed)
Printed calendar and avs. °

## 2018-08-04 ENCOUNTER — Other Ambulatory Visit: Payer: Self-pay | Admitting: Internal Medicine

## 2018-08-13 IMAGING — CR DG TIBIA/FIBULA 2V*R*
2 series · 2 of 2 positions shown · non-contrast
Comparison: None

CLINICAL DATA: Twist injury to ankle walking into the bathroom this
morning causing him to fall onto RIGHT leg

EXAM:
RIGHT TIBIA AND FIBULA - 2 VIEW

[x tib-fib ap right]
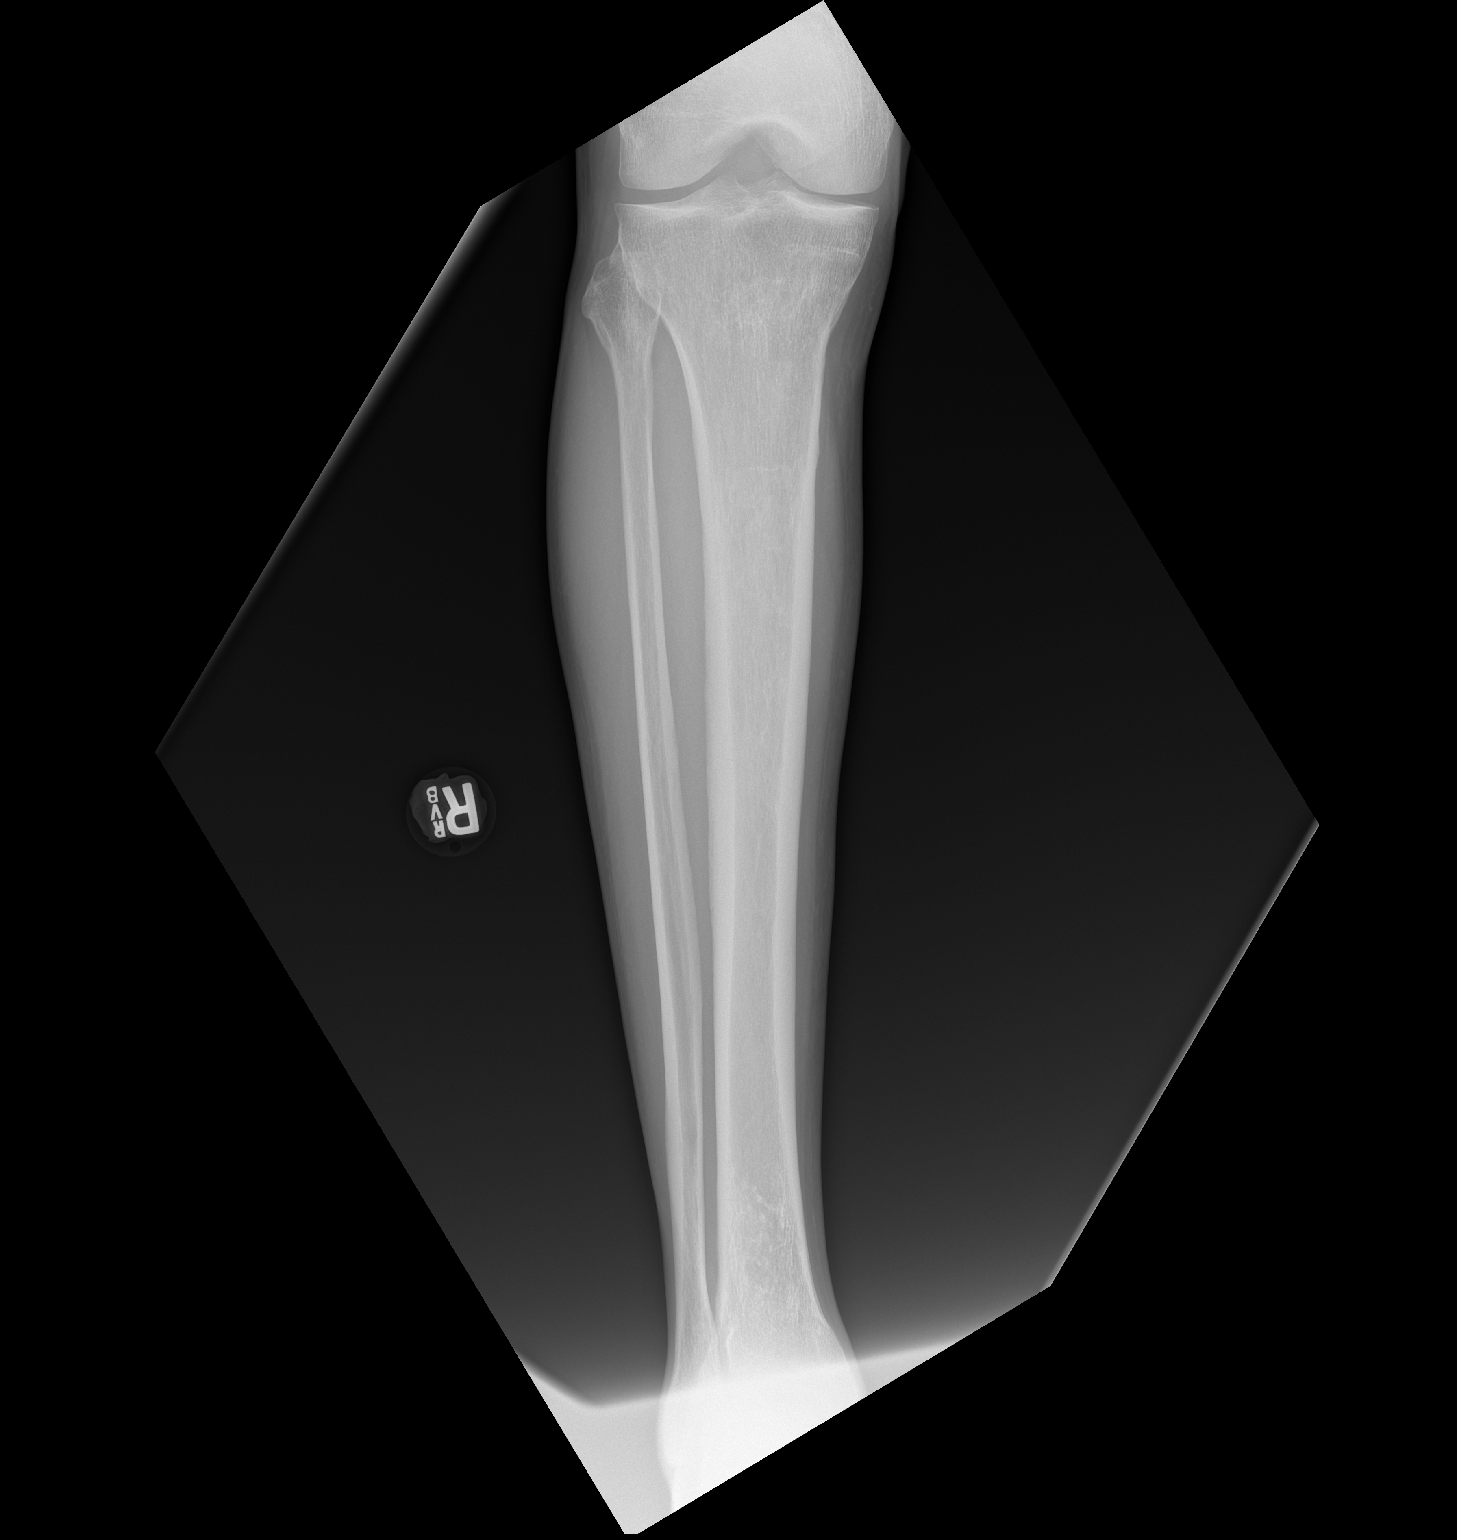

[x tib-fib lat right]
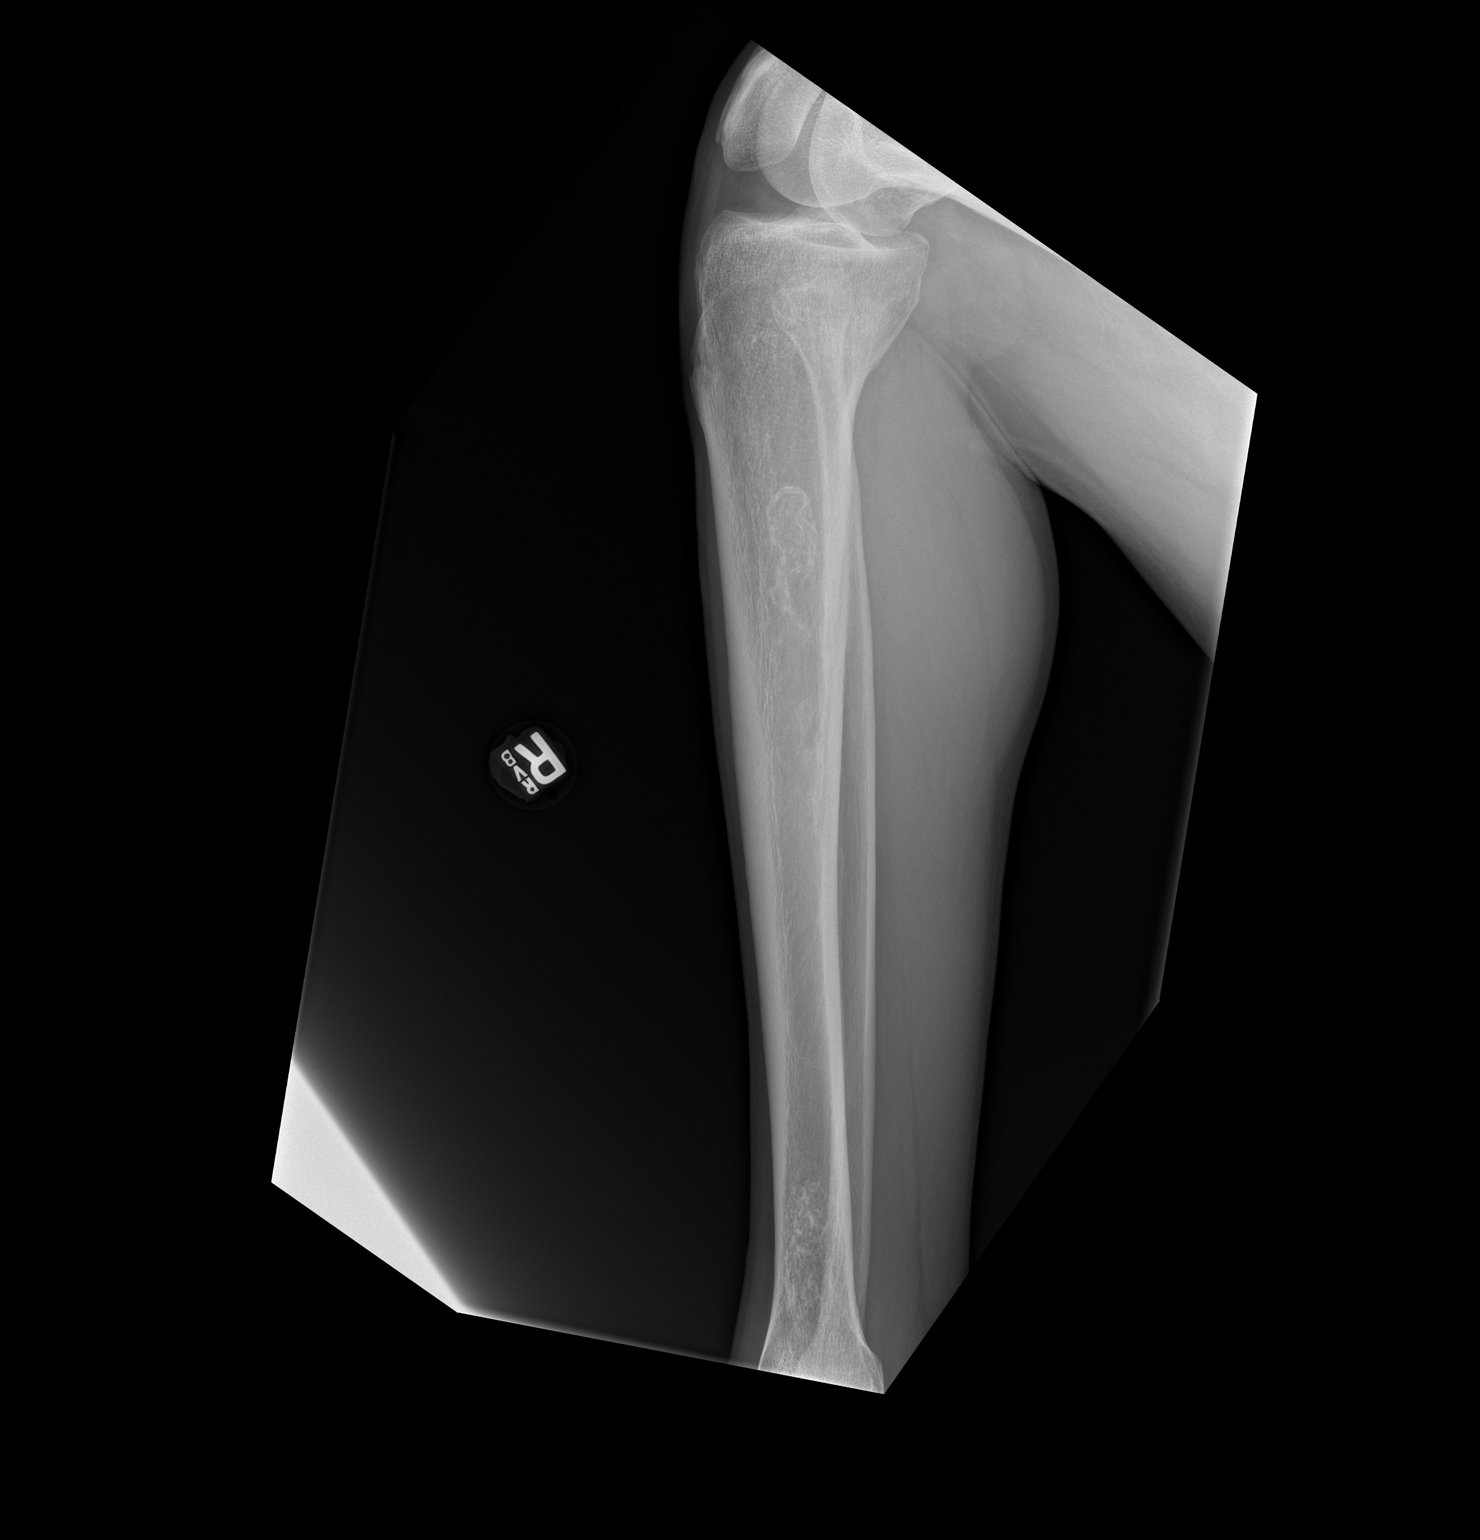

[2 of 2 positions shown; findings below may reference images not displayed]

FINDINGS: Ankle exclude, image and reported separately.

Osseous demineralization.

Minimal knee joint space narrowing.

Curvilinear area of sclerosis in the proximal RIGHT tibial diaphysis
5.2 cm length.

Additional area of mixed sclerosis in the distal tibial diaphysis
approximately 5.0 cm length.

These could represent areas of prior bone infarction or enchondroma.

No acute fracture, dislocation, or bone destruction.
IMPRESSION: Areas of sclerosis within the proximal and distal RIGHT tibial
diaphysis question bone infarct versus enchondroma.

No acute fracture or dislocation.

Osseous demineralization with minimal degenerative changes RIGHT
knee.

## 2018-08-22 ENCOUNTER — Other Ambulatory Visit: Payer: Self-pay | Admitting: Emergency Medicine

## 2018-08-22 DIAGNOSIS — I10 Essential (primary) hypertension: Secondary | ICD-10-CM

## 2018-08-24 NOTE — Telephone Encounter (Signed)
Called left message for pt to call back and make appointment

## 2018-09-04 ENCOUNTER — Other Ambulatory Visit: Payer: Self-pay | Admitting: Internal Medicine

## 2018-10-06 ENCOUNTER — Other Ambulatory Visit: Payer: Self-pay | Admitting: Internal Medicine

## 2018-11-04 ENCOUNTER — Other Ambulatory Visit: Payer: Self-pay | Admitting: Internal Medicine

## 2018-11-17 ENCOUNTER — Other Ambulatory Visit: Payer: Self-pay

## 2018-11-17 ENCOUNTER — Encounter: Payer: Self-pay | Admitting: Internal Medicine

## 2018-11-17 ENCOUNTER — Inpatient Hospital Stay (HOSPITAL_BASED_OUTPATIENT_CLINIC_OR_DEPARTMENT_OTHER): Payer: Medicare HMO | Admitting: Internal Medicine

## 2018-11-17 ENCOUNTER — Inpatient Hospital Stay: Payer: Medicare HMO | Attending: Internal Medicine

## 2018-11-17 VITALS — BP 133/42 | HR 65 | Temp 98.6°F | Resp 18 | Ht 72.0 in | Wt 178.2 lb

## 2018-11-17 DIAGNOSIS — I2782 Chronic pulmonary embolism: Secondary | ICD-10-CM | POA: Diagnosis present

## 2018-11-17 DIAGNOSIS — Z7901 Long term (current) use of anticoagulants: Secondary | ICD-10-CM

## 2018-11-17 DIAGNOSIS — I825Z2 Chronic embolism and thrombosis of unspecified deep veins of left distal lower extremity: Secondary | ICD-10-CM

## 2018-11-17 LAB — CBC WITH DIFFERENTIAL (CANCER CENTER ONLY)
ABS IMMATURE GRANULOCYTES: 0 10*3/uL (ref 0.00–0.07)
Basophils Absolute: 0 10*3/uL (ref 0.0–0.1)
Basophils Relative: 1 %
Eosinophils Absolute: 0.1 10*3/uL (ref 0.0–0.5)
Eosinophils Relative: 3 %
HEMATOCRIT: 44.1 % (ref 39.0–52.0)
HEMOGLOBIN: 14.4 g/dL (ref 13.0–17.0)
Immature Granulocytes: 0 %
LYMPHS ABS: 1.5 10*3/uL (ref 0.7–4.0)
LYMPHS PCT: 39 %
MCH: 30.6 pg (ref 26.0–34.0)
MCHC: 32.7 g/dL (ref 30.0–36.0)
MCV: 93.6 fL (ref 80.0–100.0)
MONOS PCT: 11 %
Monocytes Absolute: 0.4 10*3/uL (ref 0.1–1.0)
NEUTROS ABS: 1.7 10*3/uL (ref 1.7–7.7)
NEUTROS PCT: 46 %
Platelet Count: 195 10*3/uL (ref 150–400)
RBC: 4.71 MIL/uL (ref 4.22–5.81)
RDW: 11.9 % (ref 11.5–15.5)
WBC Count: 3.7 10*3/uL — ABNORMAL LOW (ref 4.0–10.5)
nRBC: 0 % (ref 0.0–0.2)

## 2018-11-17 NOTE — Progress Notes (Signed)
[place] [phone]   [phone]  OFFICE PROGRESS NOTE  Harvie Junior, MD [place] [address]  DIAGNOSIS: History of deep venous thrombosis and pulmonary embolism diagnosed in 2006.  PRIOR THERAPY: None.  CURRENT THERAPY: Coumadin 6 mg by mouth daily   INTERVAL HISTORY: Travis Morgan 68 y.o. male returns to the clinic today for 6 months follow-up visit.  The patient is feeling fine today with no concerning complaints.  He denied having any weight loss or night sweats.  He denied having any nausea, vomiting, diarrhea or constipation.  He has no headache or visual changes.  The patient has no bleeding issues.  He is tolerating his treatment with Coumadin fairly well.  He is here today for evaluation and repeat blood work.  MEDICAL HISTORY: Past Medical History:  Diagnosis Date  . DVT (deep venous thrombosis) ([place])   . Stroke [facility])     ALLERGIES:  has No Known Allergies.  MEDICATIONS:  Current Outpatient Medications  Medication Sig Dispense Refill  . acetaminophen (TYLENOL) 500 MG tablet Take 500 mg by mouth every 6 (six) hours as needed for mild pain.     Marland Kitchen gabapentin (NEURONTIN) 300 MG capsule Take 300 mg by mouth daily.     Marland Kitchen HYDROcodone-ibuprofen (VICOPROFEN) 7.5-200 MG tablet Take 1 tablet by mouth every 6 (six) hours as needed for moderate pain.     Marland Kitchen lisinopril-hydrochlorothiazide (PRINZIDE,ZESTORETIC) 20-12.5 MG tablet Take 1 tablet by mouth daily. 90 tablet 1  . traMADol (ULTRAM) 50 MG tablet Take 1 tablet (50 mg total) by mouth every 6 (six) hours as needed. (Patient not taking: Reported on 02/11/2018) 15 tablet 0  . warfarin (COUMADIN) 2 MG tablet Take 3 tablets by mouth once daily 90 tablet 0   No current facility-administered medications for this visit.     SURGICAL HISTORY: No past surgical history on file.  REVIEW OF SYSTEMS:  A comprehensive review of systems was negative.   PHYSICAL  EXAMINATION: General appearance: alert, cooperative and no distress Head: Normocephalic, without obvious abnormality, atraumatic Neck: no adenopathy, no JVD, supple, symmetrical, trachea midline and thyroid not enlarged, symmetric, no tenderness/mass/nodules Lymph nodes: Cervical, supraclavicular, and axillary nodes normal. Resp: clear to auscultation bilaterally Back: symmetric, no curvature. ROM normal. No CVA tenderness. Cardio: regular rate and rhythm, S1, S2 normal, no murmur, click, rub or gallop GI: soft, non-tender; bowel sounds normal; no masses,  no organomegaly Extremities: extremities normal, atraumatic, no cyanosis or edema  ECOG PERFORMANCE STATUS: 1 - Symptomatic but completely ambulatory  Blood pressure (!) 133/42, pulse 65, temperature 98.6 F (37 C), temperature source Oral, resp. rate 18, height 6' (1.829 m), weight 178 lb 3.2 oz (80.8 kg), SpO2 100 %.  LABORATORY DATA: Lab Results  Component Value Date   WBC 3.7 (L) 11/17/2018   HGB 14.4 11/17/2018   HCT 44.1 11/17/2018   MCV 93.6 11/17/2018   PLT 195 11/17/2018      Chemistry      Component Value Date/Time   NA 141 12/26/2014 0829   K 4.1 12/26/2014 0829   CL 103 12/21/2012 1529   CO2 25 12/26/2014 0829   BUN 12.3 12/26/2014 0829   CREATININE 1.2 12/26/2014 0829      Component Value Date/Time   CALCIUM 9.1 12/26/2014 0829   ALKPHOS 74 12/26/2014 0829   AST 19 12/26/2014 0829   ALT 24 12/26/2014 0829   BILITOT 0.47 12/26/2014 0829  RADIOGRAPHIC STUDIES: No results found.  ASSESSMENT AND PLAN:  This is a very pleasant 68 years old African-American male with history of deep venous thrombosis as well as pulmonary embolism and 2006 and has been on treatment with chronic anticoagulation with Coumadin since that time. His Coumadin dose is currently 6 mg p.o. daily.   He has been tolerating this treatment well with no concerning adverse effects. His CBC today is unremarkable except for mild  leukocytopenia.  PT/INR still pending. I recommended for the patient to continue his current treatment with Coumadin with the same dose. We will see him back for follow-up visit in 6 months for evaluation and repeat blood work. He was advised to call immediately if he has any concerning symptoms in the interval. The patient voices understanding of current disease status and treatment options and is in agreement with the current care plan. All questions were answered. The patient knows to call the clinic with any problems, questions or concerns. We can certainly see the patient much sooner if necessary. I spent 10 minutes counseling the patient face to face. The total time spent in the appointment was 15 minutes.  Disclaimer: This note was dictated with voice recognition software. Similar sounding words can inadvertently be transcribed and may not be corrected upon review.

## 2018-11-19 ENCOUNTER — Telehealth: Payer: Self-pay | Admitting: Internal Medicine

## 2018-11-19 NOTE — Telephone Encounter (Signed)
Tried to reach regarding 9/30 I did leave a message

## 2018-11-20 LAB — PT FACTOR INHIBITOR (MIXING STUDY)
1 HR INCUB PT 1:1NP: 11.7 s — ABNORMAL HIGH (ref 9.6–11.5)
PT 1:1NP: 11.8 s — ABNORMAL HIGH (ref 9.6–11.5)
PT: 23.5 s — ABNORMAL HIGH (ref 9.6–11.5)

## 2018-12-03 ENCOUNTER — Other Ambulatory Visit: Payer: Self-pay | Admitting: Internal Medicine

## 2019-01-03 ENCOUNTER — Other Ambulatory Visit: Payer: Self-pay | Admitting: Internal Medicine

## 2019-02-02 ENCOUNTER — Other Ambulatory Visit: Payer: Self-pay | Admitting: Internal Medicine

## 2019-03-05 ENCOUNTER — Other Ambulatory Visit: Payer: Self-pay | Admitting: Internal Medicine

## 2019-04-05 ENCOUNTER — Other Ambulatory Visit: Payer: Self-pay | Admitting: Internal Medicine

## 2019-05-05 ENCOUNTER — Other Ambulatory Visit: Payer: Self-pay | Admitting: Internal Medicine

## 2019-05-19 ENCOUNTER — Inpatient Hospital Stay: Payer: Medicare HMO | Attending: Internal Medicine

## 2019-05-19 ENCOUNTER — Inpatient Hospital Stay (HOSPITAL_BASED_OUTPATIENT_CLINIC_OR_DEPARTMENT_OTHER): Payer: Medicare HMO | Admitting: Internal Medicine

## 2019-05-19 ENCOUNTER — Telehealth: Payer: Self-pay | Admitting: Internal Medicine

## 2019-05-19 ENCOUNTER — Other Ambulatory Visit: Payer: Self-pay

## 2019-05-19 ENCOUNTER — Encounter: Payer: Self-pay | Admitting: Internal Medicine

## 2019-05-19 VITALS — BP 141/77 | HR 64 | Temp 98.3°F | Resp 18 | Ht 72.0 in | Wt 170.1 lb

## 2019-05-19 DIAGNOSIS — Z79899 Other long term (current) drug therapy: Secondary | ICD-10-CM | POA: Insufficient documentation

## 2019-05-19 DIAGNOSIS — I82532 Chronic embolism and thrombosis of left popliteal vein: Secondary | ICD-10-CM | POA: Diagnosis not present

## 2019-05-19 DIAGNOSIS — Z86711 Personal history of pulmonary embolism: Secondary | ICD-10-CM | POA: Diagnosis present

## 2019-05-19 DIAGNOSIS — Z8673 Personal history of transient ischemic attack (TIA), and cerebral infarction without residual deficits: Secondary | ICD-10-CM | POA: Insufficient documentation

## 2019-05-19 DIAGNOSIS — Z86718 Personal history of other venous thrombosis and embolism: Secondary | ICD-10-CM | POA: Insufficient documentation

## 2019-05-19 DIAGNOSIS — I2782 Chronic pulmonary embolism: Secondary | ICD-10-CM | POA: Diagnosis not present

## 2019-05-19 DIAGNOSIS — Z7901 Long term (current) use of anticoagulants: Secondary | ICD-10-CM | POA: Insufficient documentation

## 2019-05-19 LAB — CBC WITH DIFFERENTIAL (CANCER CENTER ONLY)
Abs Immature Granulocytes: 0 10*3/uL (ref 0.00–0.07)
Basophils Absolute: 0 10*3/uL (ref 0.0–0.1)
Basophils Relative: 1 %
Eosinophils Absolute: 0.1 10*3/uL (ref 0.0–0.5)
Eosinophils Relative: 2 %
HCT: 43.9 % (ref 39.0–52.0)
Hemoglobin: 14.5 g/dL (ref 13.0–17.0)
Immature Granulocytes: 0 %
Lymphocytes Relative: 31 %
Lymphs Abs: 1.2 10*3/uL (ref 0.7–4.0)
MCH: 30.9 pg (ref 26.0–34.0)
MCHC: 33 g/dL (ref 30.0–36.0)
MCV: 93.6 fL (ref 80.0–100.0)
Monocytes Absolute: 0.4 10*3/uL (ref 0.1–1.0)
Monocytes Relative: 9 %
Neutro Abs: 2.3 10*3/uL (ref 1.7–7.7)
Neutrophils Relative %: 57 %
Platelet Count: 222 10*3/uL (ref 150–400)
RBC: 4.69 MIL/uL (ref 4.22–5.81)
RDW: 11.4 % — ABNORMAL LOW (ref 11.5–15.5)
WBC Count: 3.9 10*3/uL — ABNORMAL LOW (ref 4.0–10.5)
nRBC: 0 % (ref 0.0–0.2)

## 2019-05-19 LAB — PROTIME-INR
INR: 1.8 — ABNORMAL HIGH (ref 0.8–1.2)
Prothrombin Time: 20.2 seconds — ABNORMAL HIGH (ref 11.4–15.2)

## 2019-05-19 NOTE — Telephone Encounter (Signed)
No 9/30 LOS  

## 2019-05-19 NOTE — Progress Notes (Signed)
[place] [phone]   [phone]) [phone]  OFFICE PROGRESS NOTE  Harvie Junior, MD [place] [address]  DIAGNOSIS: History of deep venous thrombosis and pulmonary embolism diagnosed in 2006.  PRIOR THERAPY: None.  CURRENT THERAPY: Coumadin 6 mg by mouth daily   INTERVAL HISTORY: Travis Morgan 68 y.o. male returns to the clinic today for 6 months follow-up visit.  The patient is feeling fine today with no concerning complaints.  He denied having any current chest pain, shortness of breath, cough or hemoptysis.  He denied having any nausea, vomiting, diarrhea or constipation.  He has no bleeding, bruises or ecchymosis.  He is here today for evaluation and repeat CBC as well as PT/INR.  He is currently on Coumadin 6 mg p.o. daily and has been tolerating it fairly well.  MEDICAL HISTORY: Past Medical History:  Diagnosis Date  . DVT (deep venous thrombosis) ([place])   . Stroke [facility])     ALLERGIES:  has No Known Allergies.  MEDICATIONS:  Current Outpatient Medications  Medication Sig Dispense Refill  . acetaminophen (TYLENOL) 500 MG tablet Take 500 mg by mouth every 6 (six) hours as needed for mild pain.     Marland Kitchen gabapentin (NEURONTIN) 300 MG capsule Take 300 mg by mouth daily.     Marland Kitchen HYDROcodone-ibuprofen (VICOPROFEN) 7.5-200 MG tablet Take 1 tablet by mouth every 6 (six) hours as needed for moderate pain.     Marland Kitchen lisinopril-hydrochlorothiazide (PRINZIDE,ZESTORETIC) 20-12.5 MG tablet Take 1 tablet by mouth daily. 90 tablet 1  . traMADol (ULTRAM) 50 MG tablet Take 1 tablet (50 mg total) by mouth every 6 (six) hours as needed. (Patient not taking: Reported on 02/11/2018) 15 tablet 0  . warfarin (COUMADIN) 2 MG tablet Take 3 tablets by mouth once daily 90 tablet 0   No current facility-administered medications for this visit.     SURGICAL HISTORY: No past surgical history on file.  REVIEW OF SYSTEMS:  A comprehensive review of  systems was negative.   PHYSICAL EXAMINATION: General appearance: alert, cooperative and no distress Head: Normocephalic, without obvious abnormality, atraumatic Neck: no adenopathy, no JVD, supple, symmetrical, trachea midline and thyroid not enlarged, symmetric, no tenderness/mass/nodules Lymph nodes: Cervical, supraclavicular, and axillary nodes normal. Resp: clear to auscultation bilaterally Back: symmetric, no curvature. ROM normal. No CVA tenderness. Cardio: regular rate and rhythm, S1, S2 normal, no murmur, click, rub or gallop GI: soft, non-tender; bowel sounds normal; no masses,  no organomegaly Extremities: extremities normal, atraumatic, no cyanosis or edema  ECOG PERFORMANCE STATUS: 1 - Symptomatic but completely ambulatory  There were no vitals taken for this visit.  LABORATORY DATA: Lab Results  Component Value Date   WBC 3.9 (L) 05/19/2019   HGB 14.5 05/19/2019   HCT 43.9 05/19/2019   MCV 93.6 05/19/2019   PLT 222 05/19/2019      Chemistry      Component Value Date/Time   NA 141 12/26/2014 0829   K 4.1 12/26/2014 0829   CL 103 12/21/2012 1529   CO2 25 12/26/2014 0829   BUN 12.3 12/26/2014 0829   CREATININE 1.2 12/26/2014 0829      Component Value Date/Time   CALCIUM 9.1 12/26/2014 0829   ALKPHOS 74 12/26/2014 0829   AST 19 12/26/2014 0829   ALT 24 12/26/2014 0829   BILITOT 0.47 12/26/2014 0829       RADIOGRAPHIC STUDIES: No results found.  ASSESSMENT AND PLAN:  This is a very pleasant 68 years old African-American male with history of deep venous thrombosis as well as pulmonary embolism and 2006 and has been on treatment with chronic anticoagulation with Coumadin since that time. His Coumadin dose is currently 6 mg p.o. daily.   His PT/INR today is 20.2/1.8. I recommended for the patient to continue his current treatment with Coumadin with the same dose. We will see him back for follow-up visit in 6 months with repeat blood work. He was advised to  call immediately if he has any concerning symptoms in the interval. The patient voices understanding of current disease status and treatment options and is in agreement with the current care plan. All questions were answered. The patient knows to call the clinic with any problems, questions or concerns. We can certainly see the patient much sooner if necessary. I spent 10 minutes counseling the patient face to face. The total time spent in the appointment was 15 minutes.  Disclaimer: This note was dictated with voice recognition software. Similar sounding words can inadvertently be transcribed and may not be corrected upon review.

## 2019-05-24 ENCOUNTER — Other Ambulatory Visit: Payer: Medicare HMO

## 2019-05-24 ENCOUNTER — Ambulatory Visit: Payer: Medicare HMO | Admitting: Internal Medicine

## 2019-06-08 ENCOUNTER — Other Ambulatory Visit: Payer: Self-pay | Admitting: Internal Medicine

## 2019-07-05 ENCOUNTER — Other Ambulatory Visit: Payer: Self-pay | Admitting: Internal Medicine

## 2019-08-08 ENCOUNTER — Other Ambulatory Visit: Payer: Self-pay | Admitting: Internal Medicine

## 2019-09-07 ENCOUNTER — Other Ambulatory Visit: Payer: Self-pay | Admitting: Internal Medicine

## 2019-10-09 ENCOUNTER — Other Ambulatory Visit: Payer: Self-pay | Admitting: Internal Medicine

## 2019-11-08 ENCOUNTER — Other Ambulatory Visit: Payer: Self-pay | Admitting: Internal Medicine

## 2019-12-07 ENCOUNTER — Other Ambulatory Visit: Payer: Self-pay | Admitting: Internal Medicine

## 2020-01-08 ENCOUNTER — Other Ambulatory Visit: Payer: Self-pay | Admitting: Internal Medicine

## 2020-02-13 ENCOUNTER — Other Ambulatory Visit: Payer: Self-pay | Admitting: Internal Medicine

## 2020-03-13 ENCOUNTER — Other Ambulatory Visit: Payer: Self-pay | Admitting: Internal Medicine

## 2020-04-13 ENCOUNTER — Other Ambulatory Visit: Payer: Self-pay | Admitting: Internal Medicine

## 2020-04-14 ENCOUNTER — Telehealth: Payer: Self-pay | Admitting: Internal Medicine

## 2020-04-14 NOTE — Telephone Encounter (Signed)
Scheduled appt per 8/27 sch msg- unable to reach pt. Left message for patient with appt date and time

## 2020-04-20 ENCOUNTER — Inpatient Hospital Stay: Payer: Medicare HMO

## 2020-04-20 ENCOUNTER — Other Ambulatory Visit: Payer: Self-pay

## 2020-04-20 ENCOUNTER — Inpatient Hospital Stay: Payer: Medicare HMO | Attending: Internal Medicine | Admitting: Internal Medicine

## 2020-04-20 VITALS — BP 147/82 | HR 92 | Temp 98.0°F | Resp 18 | Ht 72.0 in | Wt 177.1 lb

## 2020-04-20 DIAGNOSIS — I82542 Chronic embolism and thrombosis of left tibial vein: Secondary | ICD-10-CM | POA: Diagnosis not present

## 2020-04-20 DIAGNOSIS — I2782 Chronic pulmonary embolism: Secondary | ICD-10-CM

## 2020-04-20 DIAGNOSIS — Z86711 Personal history of pulmonary embolism: Secondary | ICD-10-CM | POA: Insufficient documentation

## 2020-04-20 DIAGNOSIS — Z7901 Long term (current) use of anticoagulants: Secondary | ICD-10-CM | POA: Insufficient documentation

## 2020-04-20 DIAGNOSIS — Z8673 Personal history of transient ischemic attack (TIA), and cerebral infarction without residual deficits: Secondary | ICD-10-CM | POA: Insufficient documentation

## 2020-04-20 DIAGNOSIS — Z79899 Other long term (current) drug therapy: Secondary | ICD-10-CM | POA: Insufficient documentation

## 2020-04-20 DIAGNOSIS — Z86718 Personal history of other venous thrombosis and embolism: Secondary | ICD-10-CM | POA: Diagnosis present

## 2020-04-20 LAB — CBC WITH DIFFERENTIAL (CANCER CENTER ONLY)
Abs Immature Granulocytes: 0.01 10*3/uL (ref 0.00–0.07)
Basophils Absolute: 0.1 10*3/uL (ref 0.0–0.1)
Basophils Relative: 1 %
Eosinophils Absolute: 0.1 10*3/uL (ref 0.0–0.5)
Eosinophils Relative: 2 %
HCT: 45.2 % (ref 39.0–52.0)
Hemoglobin: 15.2 g/dL (ref 13.0–17.0)
Immature Granulocytes: 0 %
Lymphocytes Relative: 38 %
Lymphs Abs: 2 10*3/uL (ref 0.7–4.0)
MCH: 29.9 pg (ref 26.0–34.0)
MCHC: 33.6 g/dL (ref 30.0–36.0)
MCV: 89 fL (ref 80.0–100.0)
Monocytes Absolute: 0.5 10*3/uL (ref 0.1–1.0)
Monocytes Relative: 9 %
Neutro Abs: 2.5 10*3/uL (ref 1.7–7.7)
Neutrophils Relative %: 50 %
Platelet Count: 236 10*3/uL (ref 150–400)
RBC: 5.08 MIL/uL (ref 4.22–5.81)
RDW: 11.6 % (ref 11.5–15.5)
WBC Count: 5.2 10*3/uL (ref 4.0–10.5)
nRBC: 0 % (ref 0.0–0.2)

## 2020-04-20 LAB — CMP (CANCER CENTER ONLY)
ALT: 28 U/L (ref 0–44)
AST: 27 U/L (ref 15–41)
Albumin: 4 g/dL (ref 3.5–5.0)
Alkaline Phosphatase: 103 U/L (ref 38–126)
Anion gap: 9 (ref 5–15)
BUN: 19 mg/dL (ref 8–23)
CO2: 27 mmol/L (ref 22–32)
Calcium: 10.3 mg/dL (ref 8.9–10.3)
Chloride: 102 mmol/L (ref 98–111)
Creatinine: 1.32 mg/dL — ABNORMAL HIGH (ref 0.61–1.24)
GFR, Est AFR Am: >60 mL/min (ref >60)
GFR, Estimated: 56 mL/min — ABNORMAL LOW (ref >60)
Glucose, Bld: 109 mg/dL — ABNORMAL HIGH (ref 70–99)
Potassium: 4.3 mmol/L (ref 3.5–5.1)
Sodium: 138 mmol/L (ref 135–145)
Total Bilirubin: 0.5 mg/dL (ref 0.3–1.2)
Total Protein: 7.8 g/dL (ref 6.5–8.1)

## 2020-04-20 LAB — PROTIME-INR
INR: 2.1 — ABNORMAL HIGH (ref 0.8–1.2)
Prothrombin Time: 23.1 seconds — ABNORMAL HIGH (ref 11.4–15.2)

## 2020-04-20 NOTE — Progress Notes (Signed)
[place] Telephone:([phone]   [phone]) [phone]  OFFICE PROGRESS NOTE  Harvie Junior, MD [place] [address]  DIAGNOSIS: History of deep venous thrombosis and pulmonary embolism diagnosed in 2006.  PRIOR THERAPY: None.  CURRENT THERAPY: Coumadin 6 mg by mouth daily   INTERVAL HISTORY: Travis Morgan 69 y.o. male returns to the clinic today for follow-up visit.  The patient is feeling fine today with no concerning complaints.  He denied having any chest pain, shortness of breath, cough or hemoptysis.  He denied having any fever or chills.  He has no nausea, vomiting, diarrhea or constipation.  He has no bleeding, bruises or ecchymosis.  He has no swelling of the lower extremities.  The patient continues to tolerate his treatment with Coumadin fairly well.  He is here today for evaluation and repeat blood work.  MEDICAL HISTORY: Past Medical History:  Diagnosis Date  . DVT (deep venous thrombosis) ([place])   . Stroke [facility])     ALLERGIES:  has No Known Allergies.  MEDICATIONS:  Current Outpatient Medications  Medication Sig Dispense Refill  . acetaminophen (TYLENOL) 500 MG tablet Take 500 mg by mouth every 6 (six) hours as needed for mild pain.     Marland Kitchen albuterol (VENTOLIN HFA) 108 (90 Base) MCG/ACT inhaler     . BREO ELLIPTA 100-25 MCG/INH AEPB     . cyclobenzaprine (FLEXERIL) 10 MG tablet cyclobenzaprine 10 mg tablet  TAKE 1 TABLET BY MOUTH THREE TIMES DAILY    . gabapentin (NEURONTIN) 300 MG capsule Take 300 mg by mouth daily.     Marland Kitchen gabapentin (NEURONTIN) 400 MG capsule     . HYDROcodone-ibuprofen (VICOPROFEN) 7.5-200 MG tablet Take 1 tablet by mouth every 6 (six) hours as needed for moderate pain.     Marland Kitchen lisinopril-hydrochlorothiazide (PRINZIDE,ZESTORETIC) 20-12.5 MG tablet Take 1 tablet by mouth daily. 90 tablet 1  . pravastatin (PRAVACHOL) 10 MG tablet Take 10 mg by mouth daily.    . traMADol (ULTRAM) 50 MG tablet Take 1  tablet (50 mg total) by mouth every 6 (six) hours as needed. (Patient not taking: Reported on 02/11/2018) 15 tablet 0  . warfarin (COUMADIN) 2 MG tablet Take 3 tablets by mouth once daily 90 tablet 0   No current facility-administered medications for this visit.    SURGICAL HISTORY: No past surgical history on file.  REVIEW OF SYSTEMS:  A comprehensive review of systems was negative.   PHYSICAL EXAMINATION: General appearance: alert, cooperative and no distress Head: Normocephalic, without obvious abnormality, atraumatic Neck: no adenopathy, no JVD, supple, symmetrical, trachea midline and thyroid not enlarged, symmetric, no tenderness/mass/nodules Lymph nodes: Cervical, supraclavicular, and axillary nodes normal. Resp: clear to auscultation bilaterally Back: symmetric, no curvature. ROM normal. No CVA tenderness. Cardio: regular rate and rhythm, S1, S2 normal, no murmur, click, rub or gallop GI: soft, non-tender; bowel sounds normal; no masses,  no organomegaly Extremities: extremities normal, atraumatic, no cyanosis or edema  ECOG PERFORMANCE STATUS: 1 - Symptomatic but completely ambulatory  Blood pressure (!) 147/82, pulse 92, temperature 98 F (36.7 C), temperature source Tympanic, resp. rate 18, height 6' (1.829 m), weight 177 lb 1.6 oz (80.3 kg), SpO2 100 %.  LABORATORY DATA: Lab Results  Component Value Date   WBC 5.2 04/20/2020   HGB 15.2 04/20/2020   HCT 45.2 04/20/2020   MCV 89.0 04/20/2020   PLT 236 04/20/2020      Chemistry  Component Value Date/Time   NA 138 04/20/2020 0929   NA 141 12/26/2014 0829   K 4.3 04/20/2020 0929   K 4.1 12/26/2014 0829   CL 102 04/20/2020 0929   CL 103 12/21/2012 1529   CO2 27 04/20/2020 0929   CO2 25 12/26/2014 0829   BUN 19 04/20/2020 0929   BUN 12.3 12/26/2014 0829   CREATININE 1.32 (H) 04/20/2020 0929   CREATININE 1.2 12/26/2014 0829      Component Value Date/Time   CALCIUM 10.3 04/20/2020 0929   CALCIUM 9.1  12/26/2014 0829   ALKPHOS 103 04/20/2020 0929   ALKPHOS 74 12/26/2014 0829   AST 27 04/20/2020 0929   AST 19 12/26/2014 0829   ALT 28 04/20/2020 0929   ALT 24 12/26/2014 0829   BILITOT 0.5 04/20/2020 0929   BILITOT 0.47 12/26/2014 0829       RADIOGRAPHIC STUDIES: No results found.  ASSESSMENT AND PLAN:  This is a very pleasant 69 years old African-American male with history of deep venous thrombosis as well as pulmonary embolism and 2006 and has been on treatment with chronic anticoagulation with Coumadin since that time. His Coumadin dose is currently 6 mg p.o. daily.  He is tolerating this treatment well. CBC and comprehensive metabolic panel are unremarkable today except for mild renal insufficiency.  PT/INR is still pending. I recommended for the patient to continue with the same dose of his treatment with Coumadin for now. I will see him back for follow-up visit in 6 months for evaluation and repeat blood work. He was advised to call immediately if he has any concerning symptoms in the interval. The patient voices understanding of current disease status and treatment options and is in agreement with the current care plan. All questions were answered. The patient knows to call the clinic with any problems, questions or concerns. We can certainly see the patient much sooner if necessary.  Disclaimer: This note was dictated with voice recognition software. Similar sounding words can inadvertently be transcribed and may not be corrected upon review.

## 2020-04-26 ENCOUNTER — Telehealth: Payer: Self-pay | Admitting: Internal Medicine

## 2020-04-26 NOTE — Telephone Encounter (Signed)
Scheduled per los. Called and left msg. Mailed printout  °

## 2020-05-14 ENCOUNTER — Other Ambulatory Visit: Payer: Self-pay | Admitting: Internal Medicine

## 2020-06-12 ENCOUNTER — Other Ambulatory Visit: Payer: Self-pay | Admitting: Internal Medicine

## 2020-07-14 ENCOUNTER — Other Ambulatory Visit: Payer: Self-pay | Admitting: Internal Medicine

## 2020-07-14 NOTE — Telephone Encounter (Signed)
For approval or refusal. Deeanna Beightol M Jevaughn Degollado, RN  

## 2020-08-15 ENCOUNTER — Other Ambulatory Visit: Payer: Self-pay | Admitting: Internal Medicine

## 2020-09-25 ENCOUNTER — Other Ambulatory Visit: Payer: Self-pay | Admitting: Internal Medicine

## 2020-10-19 ENCOUNTER — Inpatient Hospital Stay: Payer: Medicare HMO

## 2020-10-19 ENCOUNTER — Inpatient Hospital Stay: Payer: Medicare HMO | Attending: Internal Medicine | Admitting: Internal Medicine

## 2020-10-19 ENCOUNTER — Encounter: Payer: Self-pay | Admitting: Internal Medicine

## 2020-10-19 ENCOUNTER — Other Ambulatory Visit: Payer: Self-pay

## 2020-10-19 VITALS — BP 145/85 | HR 91 | Temp 97.6°F | Resp 16 | Ht 72.0 in | Wt 191.2 lb

## 2020-10-19 DIAGNOSIS — Z79899 Other long term (current) drug therapy: Secondary | ICD-10-CM | POA: Diagnosis not present

## 2020-10-19 DIAGNOSIS — Z7901 Long term (current) use of anticoagulants: Secondary | ICD-10-CM | POA: Diagnosis not present

## 2020-10-19 DIAGNOSIS — Z86711 Personal history of pulmonary embolism: Secondary | ICD-10-CM | POA: Insufficient documentation

## 2020-10-19 DIAGNOSIS — Z86718 Personal history of other venous thrombosis and embolism: Secondary | ICD-10-CM | POA: Diagnosis not present

## 2020-10-19 DIAGNOSIS — M25519 Pain in unspecified shoulder: Secondary | ICD-10-CM | POA: Diagnosis not present

## 2020-10-19 DIAGNOSIS — I82542 Chronic embolism and thrombosis of left tibial vein: Secondary | ICD-10-CM

## 2020-10-19 DIAGNOSIS — I82402 Acute embolism and thrombosis of unspecified deep veins of left lower extremity: Secondary | ICD-10-CM | POA: Diagnosis not present

## 2020-10-19 DIAGNOSIS — Z8673 Personal history of transient ischemic attack (TIA), and cerebral infarction without residual deficits: Secondary | ICD-10-CM | POA: Insufficient documentation

## 2020-10-19 LAB — PROTIME-INR
INR: 2.1 — ABNORMAL HIGH (ref 0.8–1.2)
Prothrombin Time: 22.9 seconds — ABNORMAL HIGH (ref 11.4–15.2)

## 2020-10-19 LAB — CBC WITH DIFFERENTIAL (CANCER CENTER ONLY)
Abs Immature Granulocytes: 0.01 10*3/uL (ref 0.00–0.07)
Basophils Absolute: 0 10*3/uL (ref 0.0–0.1)
Basophils Relative: 1 %
Eosinophils Absolute: 0.2 10*3/uL (ref 0.0–0.5)
Eosinophils Relative: 4 %
HCT: 41.7 % (ref 39.0–52.0)
Hemoglobin: 14 g/dL (ref 13.0–17.0)
Immature Granulocytes: 0 %
Lymphocytes Relative: 37 %
Lymphs Abs: 1.7 10*3/uL (ref 0.7–4.0)
MCH: 30.2 pg (ref 26.0–34.0)
MCHC: 33.6 g/dL (ref 30.0–36.0)
MCV: 90.1 fL (ref 80.0–100.0)
Monocytes Absolute: 0.4 10*3/uL (ref 0.1–1.0)
Monocytes Relative: 9 %
Neutro Abs: 2.3 10*3/uL (ref 1.7–7.7)
Neutrophils Relative %: 49 %
Platelet Count: 186 10*3/uL (ref 150–400)
RBC: 4.63 MIL/uL (ref 4.22–5.81)
RDW: 11.9 % (ref 11.5–15.5)
WBC Count: 4.5 10*3/uL (ref 4.0–10.5)
nRBC: 0 % (ref 0.0–0.2)

## 2020-10-19 NOTE — Progress Notes (Signed)
[place] [phone]   Fax:(336) [phone]  OFFICE PROGRESS NOTE  Harvie Junior, MD [place] [address]  DIAGNOSIS: History of deep venous thrombosis and pulmonary embolism diagnosed in 2006.  PRIOR THERAPY: None.  CURRENT THERAPY: Coumadin 6 mg by mouth daily   INTERVAL HISTORY: Travis Morgan 70 y.o. male returns to the clinic today for 6 months follow-up visit.  The patient is feeling fine today with no concerning complaints.  He denied having any chest pain, shortness of breath, cough or hemoptysis.  He denied having any fever or chills.  He has no nausea, vomiting, diarrhea or constipation.  He continues to have arthralgia especially in the shoulder area.  He is tolerating his treatment with Coumadin fairly well.  The patient is here today for evaluation with repeat CBC, PT/INR.   MEDICAL HISTORY: Past Medical History:  Diagnosis Date  . DVT (deep venous thrombosis) ([place])   . Stroke [facility])     ALLERGIES:  has No Known Allergies.  MEDICATIONS:  Current Outpatient Medications  Medication Sig Dispense Refill  . acetaminophen (TYLENOL) 500 MG tablet Take 500 mg by mouth every 6 (six) hours as needed for mild pain.     Marland Kitchen albuterol (VENTOLIN HFA) 108 (90 Base) MCG/ACT inhaler     . BREO ELLIPTA 100-25 MCG/INH AEPB     . cyclobenzaprine (FLEXERIL) 10 MG tablet cyclobenzaprine 10 mg tablet  TAKE 1 TABLET BY MOUTH THREE TIMES DAILY    . gabapentin (NEURONTIN) 300 MG capsule Take 300 mg by mouth daily.     Marland Kitchen gabapentin (NEURONTIN) 400 MG capsule     . HYDROcodone-ibuprofen (VICOPROFEN) 7.5-200 MG tablet Take 1 tablet by mouth every 6 (six) hours as needed for moderate pain.     Marland Kitchen lisinopril-hydrochlorothiazide (PRINZIDE,ZESTORETIC) 20-12.5 MG tablet Take 1 tablet by mouth daily. 90 tablet 1  . pravastatin (PRAVACHOL) 10 MG tablet Take 10 mg by mouth daily.    . traMADol (ULTRAM) 50 MG tablet Take 1 tablet (50 mg total)  by mouth every 6 (six) hours as needed. (Patient not taking: Reported on 02/11/2018) 15 tablet 0  . warfarin (COUMADIN) 2 MG tablet TAKE 3 TABLETS BY MOUTH ONCE DAILY **NEEDS  AN  APPOINTMENT  FOR  LAB  AND  DR.  Ashauna Bertholf** 90 tablet 0   No current facility-administered medications for this visit.    SURGICAL HISTORY: No past surgical history on file.  REVIEW OF SYSTEMS:  A comprehensive review of systems was negative.   PHYSICAL EXAMINATION: General appearance: alert, cooperative and no distress Head: Normocephalic, without obvious abnormality, atraumatic Neck: no adenopathy, no JVD, supple, symmetrical, trachea midline and thyroid not enlarged, symmetric, no tenderness/mass/nodules Lymph nodes: Cervical, supraclavicular, and axillary nodes normal. Resp: clear to auscultation bilaterally Back: symmetric, no curvature. ROM normal. No CVA tenderness. Cardio: regular rate and rhythm, S1, S2 normal, no murmur, click, rub or gallop GI: soft, non-tender; bowel sounds normal; no masses,  no organomegaly Extremities: extremities normal, atraumatic, no cyanosis or edema  ECOG PERFORMANCE STATUS: 1 - Symptomatic but completely ambulatory  Blood pressure (!) 145/85, pulse 91, temperature 97.6 F (36.4 C), temperature source Tympanic, resp. rate 16, height 6' (1.829 m), weight 191 lb 3.2 oz (86.7 kg), SpO2 100 %.  LABORATORY DATA: Lab Results  Component Value Date   WBC 4.5 10/19/2020   HGB 14.0 10/19/2020   HCT 41.7 10/19/2020   MCV 90.1 10/19/2020  PLT 186 10/19/2020      Chemistry      Component Value Date/Time   NA 138 04/20/2020 0929   NA 141 12/26/2014 0829   K 4.3 04/20/2020 0929   K 4.1 12/26/2014 0829   CL 102 04/20/2020 0929   CL 103 12/21/2012 1529   CO2 27 04/20/2020 0929   CO2 25 12/26/2014 0829   BUN 19 04/20/2020 0929   BUN 12.3 12/26/2014 0829   CREATININE 1.32 (H) 04/20/2020 0929   CREATININE 1.2 12/26/2014 0829      Component Value Date/Time   CALCIUM 10.3  04/20/2020 0929   CALCIUM 9.1 12/26/2014 0829   ALKPHOS 103 04/20/2020 0929   ALKPHOS 74 12/26/2014 0829   AST 27 04/20/2020 0929   AST 19 12/26/2014 0829   ALT 28 04/20/2020 0929   ALT 24 12/26/2014 0829   BILITOT 0.5 04/20/2020 0929   BILITOT 0.47 12/26/2014 0829       RADIOGRAPHIC STUDIES: No results found.  ASSESSMENT AND PLAN:  This is a very pleasant 70 years old African-American male with history of deep venous thrombosis as well as pulmonary embolism and 2006 and has been on treatment with chronic anticoagulation with Coumadin since that time. His Coumadin dose is currently 6 mg p.o. daily.  He is tolerating this treatment well. The patient has been tolerating this treatment well with no concerning adverse effects. Repeat CBC today is unremarkable.  His PT/INR are within the acceptable range. I recommended for the patient to continue his current treatment with Coumadin with the same dose. I will see him back for follow-up visit in 6 months for evaluation with repeat CBC, PT/INR. The patient was advised to call immediately if he has any concerning symptoms in the interval. The patient voices understanding of current disease status and treatment options and is in agreement with the current care plan. All questions were answered. The patient knows to call the clinic with any problems, questions or concerns. We can certainly see the patient much sooner if necessary.  Disclaimer: This note was dictated with voice recognition software. Similar sounding words can inadvertently be transcribed and may not be corrected upon review.

## 2020-10-23 ENCOUNTER — Telehealth: Payer: Self-pay | Admitting: Internal Medicine

## 2020-10-23 NOTE — Telephone Encounter (Signed)
Scheduled per los. Called and left msg. Mailed printout  °

## 2020-10-29 ENCOUNTER — Other Ambulatory Visit: Payer: Self-pay | Admitting: Internal Medicine

## 2020-11-28 ENCOUNTER — Other Ambulatory Visit: Payer: Self-pay | Admitting: Internal Medicine

## 2020-12-31 ENCOUNTER — Other Ambulatory Visit: Payer: Self-pay | Admitting: Internal Medicine

## 2021-02-01 ENCOUNTER — Other Ambulatory Visit: Payer: Self-pay | Admitting: Internal Medicine

## 2021-03-07 ENCOUNTER — Other Ambulatory Visit: Payer: Self-pay | Admitting: Internal Medicine

## 2021-04-04 ENCOUNTER — Other Ambulatory Visit: Payer: Self-pay | Admitting: Internal Medicine

## 2021-04-25 ENCOUNTER — Other Ambulatory Visit: Payer: Self-pay

## 2021-04-25 ENCOUNTER — Inpatient Hospital Stay: Payer: Medicare HMO

## 2021-04-25 ENCOUNTER — Inpatient Hospital Stay: Payer: Medicare HMO | Attending: Internal Medicine | Admitting: Internal Medicine

## 2021-04-25 VITALS — BP 162/94 | HR 94 | Temp 98.3°F | Resp 18 | Ht 72.0 in | Wt 191.1 lb

## 2021-04-25 DIAGNOSIS — I82402 Acute embolism and thrombosis of unspecified deep veins of left lower extremity: Secondary | ICD-10-CM

## 2021-04-25 DIAGNOSIS — M25519 Pain in unspecified shoulder: Secondary | ICD-10-CM | POA: Insufficient documentation

## 2021-04-25 DIAGNOSIS — I2782 Chronic pulmonary embolism: Secondary | ICD-10-CM

## 2021-04-25 DIAGNOSIS — Z86711 Personal history of pulmonary embolism: Secondary | ICD-10-CM | POA: Insufficient documentation

## 2021-04-25 DIAGNOSIS — Z86718 Personal history of other venous thrombosis and embolism: Secondary | ICD-10-CM | POA: Diagnosis not present

## 2021-04-25 DIAGNOSIS — Z79899 Other long term (current) drug therapy: Secondary | ICD-10-CM | POA: Insufficient documentation

## 2021-04-25 DIAGNOSIS — Z8673 Personal history of transient ischemic attack (TIA), and cerebral infarction without residual deficits: Secondary | ICD-10-CM | POA: Insufficient documentation

## 2021-04-25 DIAGNOSIS — Z7901 Long term (current) use of anticoagulants: Secondary | ICD-10-CM | POA: Insufficient documentation

## 2021-04-25 LAB — CBC WITH DIFFERENTIAL (CANCER CENTER ONLY)
Abs Immature Granulocytes: 0.01 10*3/uL (ref 0.00–0.07)
Basophils Absolute: 0.1 10*3/uL (ref 0.0–0.1)
Basophils Relative: 1 %
Eosinophils Absolute: 0.1 10*3/uL (ref 0.0–0.5)
Eosinophils Relative: 3 %
HCT: 42.8 % (ref 39.0–52.0)
Hemoglobin: 14.4 g/dL (ref 13.0–17.0)
Immature Granulocytes: 0 %
Lymphocytes Relative: 39 %
Lymphs Abs: 1.7 10*3/uL (ref 0.7–4.0)
MCH: 30.1 pg (ref 26.0–34.0)
MCHC: 33.6 g/dL (ref 30.0–36.0)
MCV: 89.5 fL (ref 80.0–100.0)
Monocytes Absolute: 0.3 10*3/uL (ref 0.1–1.0)
Monocytes Relative: 8 %
Neutro Abs: 2.2 10*3/uL (ref 1.7–7.7)
Neutrophils Relative %: 49 %
Platelet Count: 204 10*3/uL (ref 150–400)
RBC: 4.78 MIL/uL (ref 4.22–5.81)
RDW: 11.8 % (ref 11.5–15.5)
WBC Count: 4.4 10*3/uL (ref 4.0–10.5)
nRBC: 0 % (ref 0.0–0.2)

## 2021-04-25 LAB — PROTIME-INR
INR: 2.6 — ABNORMAL HIGH (ref 0.8–1.2)
Prothrombin Time: 28.1 seconds — ABNORMAL HIGH (ref 11.4–15.2)

## 2021-04-25 LAB — CMP (CANCER CENTER ONLY)
ALT: 19 U/L (ref 0–44)
AST: 19 U/L (ref 15–41)
Albumin: 4 g/dL (ref 3.5–5.0)
Alkaline Phosphatase: 97 U/L (ref 38–126)
Anion gap: 10 (ref 5–15)
BUN: 11 mg/dL (ref 8–23)
CO2: 23 mmol/L (ref 22–32)
Calcium: 9.6 mg/dL (ref 8.9–10.3)
Chloride: 106 mmol/L (ref 98–111)
Creatinine: 1.22 mg/dL (ref 0.61–1.24)
GFR, Estimated: >60 mL/min (ref >60)
Glucose, Bld: 92 mg/dL (ref 70–99)
Potassium: 4 mmol/L (ref 3.5–5.1)
Sodium: 139 mmol/L (ref 135–145)
Total Bilirubin: 0.6 mg/dL (ref 0.3–1.2)
Total Protein: 7.5 g/dL (ref 6.5–8.1)

## 2021-04-25 NOTE — Progress Notes (Signed)
[facility] Telephone:([phone]   Fax:(336) [phone]  OFFICE PROGRESS NOTE  Harvie Junior, MD [place] [address]  DIAGNOSIS: History of deep venous thrombosis and pulmonary embolism diagnosed in 2006.  PRIOR THERAPY: None.  CURRENT THERAPY: Coumadin 6 mg by mouth daily   INTERVAL HISTORY: Travis Morgan 70 y.o. male returns to the clinic today for 92-monthfollow-up visit.  The patient is feeling fine today with no concerning complaints except for arthralgia in the knees and shoulder.  He denied having any current chest pain, shortness of breath, cough or hemoptysis.  He has no nausea, vomiting, diarrhea or constipation.  He has no headache or visual changes.  He continues to tolerate his treatment with Coumadin 6 mg p.o. daily fairly well with no concerning bleeding, bruises or ecchymosis.  He is here today for evaluation and repeat CBC, PT/INR as well as comprehensive metabolic panel.  MEDICAL HISTORY: Past Medical History:  Diagnosis Date   DVT (deep venous thrombosis) (HCC)    Stroke (HCC)     ALLERGIES:  has No Known Allergies.  MEDICATIONS:  Current Outpatient Medications  Medication Sig Dispense Refill   acetaminophen (TYLENOL) 500 MG tablet Take 500 mg by mouth every 6 (six) hours as needed for mild pain.      albuterol (VENTOLIN HFA) 108 (90 Base) MCG/ACT inhaler      BREO ELLIPTA 100-25 MCG/INH AEPB      cyclobenzaprine (FLEXERIL) 10 MG tablet cyclobenzaprine 10 mg tablet  TAKE 1 TABLET BY MOUTH THREE TIMES DAILY     gabapentin (NEURONTIN) 300 MG capsule Take 300 mg by mouth daily.      gabapentin (NEURONTIN) 400 MG capsule      HYDROcodone-ibuprofen (VICOPROFEN) 7.5-200 MG tablet Take 1 tablet by mouth every 6 (six) hours as needed for moderate pain.      lisinopril-hydrochlorothiazide (PRINZIDE,ZESTORETIC) 20-12.5 MG tablet Take 1 tablet by mouth daily. 90 tablet 1   pravastatin (PRAVACHOL) 10 MG tablet Take 10  mg by mouth daily.     traMADol (ULTRAM) 50 MG tablet Take 1 tablet (50 mg total) by mouth every 6 (six) hours as needed. (Patient not taking: Reported on 02/11/2018) 15 tablet 0   warfarin (COUMADIN) 2 MG tablet TAKE 3 TABLETS BY MOUTH ONCE DAILY --NEEDS  AN  APPT  FOR  LAB  AND  DR.  Vipul Cafarelli 90 tablet 0   No current facility-administered medications for this visit.    SURGICAL HISTORY: No past surgical history on file.  REVIEW OF SYSTEMS:  A comprehensive review of systems was negative except for: Musculoskeletal: positive for arthralgias   PHYSICAL EXAMINATION: General appearance: alert, cooperative and no distress Head: Normocephalic, without obvious abnormality, atraumatic Neck: no adenopathy, no JVD, supple, symmetrical, trachea midline and thyroid not enlarged, symmetric, no tenderness/mass/nodules Lymph nodes: Cervical, supraclavicular, and axillary nodes normal. Resp: clear to auscultation bilaterally Back: symmetric, no curvature. ROM normal. No CVA tenderness. Cardio: regular rate and rhythm, S1, S2 normal, no murmur, click, rub or gallop GI: soft, non-tender; bowel sounds normal; no masses,  no organomegaly Extremities: extremities normal, atraumatic, no cyanosis or edema  ECOG PERFORMANCE STATUS: 1 - Symptomatic but completely ambulatory  Blood pressure (!) 162/94, pulse 94, temperature 98.3 F (36.8 C), temperature source Tympanic, resp. rate 18, height 6' (1.829 m), weight 191 lb 1.6 oz (86.7 kg), SpO2 100 %.  LABORATORY DATA: Lab Results  Component Value Date   WBC  4.4 04/25/2021   HGB 14.4 04/25/2021   HCT 42.8 04/25/2021   MCV 89.5 04/25/2021   PLT 204 04/25/2021      Chemistry      Component Value Date/Time   NA 138 04/20/2020 0929   NA 141 12/26/2014 0829   K 4.3 04/20/2020 0929   K 4.1 12/26/2014 0829   CL 102 04/20/2020 0929   CL 103 12/21/2012 1529   CO2 27 04/20/2020 0929   CO2 25 12/26/2014 0829   BUN 19 04/20/2020 0929   BUN 12.3 12/26/2014  0829   CREATININE 1.32 (H) 04/20/2020 0929   CREATININE 1.2 12/26/2014 0829      Component Value Date/Time   CALCIUM 10.3 04/20/2020 0929   CALCIUM 9.1 12/26/2014 0829   ALKPHOS 103 04/20/2020 0929   ALKPHOS 74 12/26/2014 0829   AST 27 04/20/2020 0929   AST 19 12/26/2014 0829   ALT 28 04/20/2020 0929   ALT 24 12/26/2014 0829   BILITOT 0.5 04/20/2020 0929   BILITOT 0.47 12/26/2014 0829       RADIOGRAPHIC STUDIES: No results found.  ASSESSMENT AND PLAN:  This is a very pleasant 70 years old African-American male with history of deep venous thrombosis as well as pulmonary embolism and 2006 and has been on treatment with chronic anticoagulation with Coumadin since that time. His Coumadin dose is currently 6 mg p.o. daily.  The patient has been tolerating this treatment well with no concerning adverse effects. His CBC today is unremarkable.  INR is 2.6. I recommended for the patient to continue on his current dose of Coumadin 6 mg p.o. daily. I will see him back for follow-up visit in 6 months for evaluation and repeat blood work. He was advised to call immediately if he has any other concerning symptoms in the interval. The patient voices understanding of current disease status and treatment options and is in agreement with the current care plan. All questions were answered. The patient knows to call the clinic with any problems, questions or concerns. We can certainly see the patient much sooner if necessary.  Disclaimer: This note was dictated with voice recognition software. Similar sounding words can inadvertently be transcribed and may not be corrected upon review.

## 2021-04-26 ENCOUNTER — Telehealth: Payer: Self-pay | Admitting: Internal Medicine

## 2021-04-26 NOTE — Telephone Encounter (Signed)
Scheduled appt per 9/7 los- mailed reminder letter with appt date and time

## 2021-05-09 ENCOUNTER — Other Ambulatory Visit: Payer: Self-pay | Admitting: Internal Medicine

## 2021-06-08 ENCOUNTER — Other Ambulatory Visit: Payer: Self-pay | Admitting: Internal Medicine

## 2021-07-11 ENCOUNTER — Other Ambulatory Visit: Payer: Self-pay | Admitting: Internal Medicine

## 2021-08-07 ENCOUNTER — Other Ambulatory Visit: Payer: Self-pay | Admitting: Internal Medicine

## 2021-09-19 ENCOUNTER — Other Ambulatory Visit: Payer: Self-pay | Admitting: Internal Medicine

## 2021-10-14 ENCOUNTER — Other Ambulatory Visit: Payer: Self-pay | Admitting: Internal Medicine

## 2021-10-24 ENCOUNTER — Inpatient Hospital Stay: Payer: Medicare HMO

## 2021-10-24 ENCOUNTER — Other Ambulatory Visit: Payer: Self-pay

## 2021-10-24 ENCOUNTER — Other Ambulatory Visit: Payer: Self-pay | Admitting: Medical Oncology

## 2021-10-24 ENCOUNTER — Inpatient Hospital Stay: Payer: Medicare HMO | Attending: Internal Medicine | Admitting: Internal Medicine

## 2021-10-24 VITALS — BP 161/88 | HR 107 | Temp 98.2°F | Resp 20 | Ht 72.0 in | Wt 185.7 lb

## 2021-10-24 DIAGNOSIS — Z86711 Personal history of pulmonary embolism: Secondary | ICD-10-CM | POA: Diagnosis not present

## 2021-10-24 DIAGNOSIS — I10 Essential (primary) hypertension: Secondary | ICD-10-CM | POA: Insufficient documentation

## 2021-10-24 DIAGNOSIS — Z7901 Long term (current) use of anticoagulants: Secondary | ICD-10-CM | POA: Insufficient documentation

## 2021-10-24 DIAGNOSIS — I82502 Chronic embolism and thrombosis of unspecified deep veins of left lower extremity: Secondary | ICD-10-CM

## 2021-10-24 DIAGNOSIS — Z79899 Other long term (current) drug therapy: Secondary | ICD-10-CM | POA: Diagnosis not present

## 2021-10-24 DIAGNOSIS — I2782 Chronic pulmonary embolism: Secondary | ICD-10-CM

## 2021-10-24 DIAGNOSIS — Z8673 Personal history of transient ischemic attack (TIA), and cerebral infarction without residual deficits: Secondary | ICD-10-CM | POA: Diagnosis not present

## 2021-10-24 LAB — PROTIME-INR
INR: 2.3 — ABNORMAL HIGH (ref 0.8–1.2)
Prothrombin Time: 25.3 seconds — ABNORMAL HIGH (ref 11.4–15.2)

## 2021-10-24 LAB — CBC WITH DIFFERENTIAL (CANCER CENTER ONLY)
Abs Immature Granulocytes: 0.01 10*3/uL (ref 0.00–0.07)
Basophils Absolute: 0.1 10*3/uL (ref 0.0–0.1)
Basophils Relative: 1 %
Eosinophils Absolute: 0.2 10*3/uL (ref 0.0–0.5)
Eosinophils Relative: 4 %
HCT: 42.4 % (ref 39.0–52.0)
Hemoglobin: 14.4 g/dL (ref 13.0–17.0)
Immature Granulocytes: 0 %
Lymphocytes Relative: 38 %
Lymphs Abs: 1.8 10*3/uL (ref 0.7–4.0)
MCH: 30.2 pg (ref 26.0–34.0)
MCHC: 34 g/dL (ref 30.0–36.0)
MCV: 88.9 fL (ref 80.0–100.0)
Monocytes Absolute: 0.3 10*3/uL (ref 0.1–1.0)
Monocytes Relative: 7 %
Neutro Abs: 2.3 10*3/uL (ref 1.7–7.7)
Neutrophils Relative %: 50 %
Platelet Count: 219 10*3/uL (ref 150–400)
RBC: 4.77 MIL/uL (ref 4.22–5.81)
RDW: 11.6 % (ref 11.5–15.5)
WBC Count: 4.6 10*3/uL (ref 4.0–10.5)
nRBC: 0 % (ref 0.0–0.2)

## 2021-10-24 NOTE — Progress Notes (Signed)
?    [facility] ?Telephone:([phone]   [phone] ? ?OFFICE PROGRESS NOTE ? ?Harvie Junior, MD ?[address] ?[place] [address] ? ?DIAGNOSIS: History of deep venous thrombosis and pulmonary embolism diagnosed in 2006. ? ?PRIOR THERAPY: None. ? ?CURRENT THERAPY: Coumadin 6 mg by mouth daily  ? ?INTERVAL HISTORY: ?Travis Morgan 71 y.o. male returns to the clinic today for 38-monthfollow-up visit.  The patient is feeling fine today with no concerning complaints.  He denied having any fatigue or weakness.  He has no bleeding, bruises or ecchymosis.  He has no nausea, vomiting, diarrhea or constipation.  He has no chest pain, shortness of breath, cough or hemoptysis.  He has no swelling of the lower extremities.  He continues to tolerate his treatment with Coumadin 6 mg p.o. daily fairly well.  He is here for evaluation and repeat blood work. ? ?MEDICAL HISTORY: ?Past Medical History:  ?Diagnosis Date  ? [place]   ? Stroke ([facility]   ? ? ?ALLERGIES:  has No Known Allergies. ? ?MEDICATIONS:  ?Current Outpatient Medications  ?Medication Sig Dispense Refill  ? ACCU-CHEK GUIDE test strip USE 1 STRIP TO CHECK BLOOD SUGAR ONCE DAILY    ? acetaminophen (TYLENOL) 500 MG tablet Take 500 mg by mouth every 6 (six) hours as needed for mild pain.     ? albuterol (VENTOLIN HFA) 108 (90 Base) MCG/ACT inhaler     ? ATROVENT HFA 17 MCG/ACT inhaler Inhale 1 puff into the lungs 4 (four) times daily as needed.    ? BREO ELLIPTA 100-25 MCG/INH AEPB     ? cyclobenzaprine (FLEXERIL) 10 MG tablet cyclobenzaprine 10 mg tablet ? TAKE 1 TABLET BY MOUTH THREE TIMES DAILY    ? ferrous gluconate (FERGON) 324 MG tablet Take 324 mg by mouth daily.    ? gabapentin (NEURONTIN) 300 MG capsule Take 300 mg by mouth daily.     ? gabapentin (NEURONTIN) 400 MG capsule     ? HYDROcodone-ibuprofen (VICOPROFEN) 7.5-200 MG tablet Take 1 tablet by mouth every 6 (six) hours as needed for  moderate pain.     ? lisinopril-hydrochlorothiazide (PRINZIDE,ZESTORETIC) 20-12.5 MG tablet Take 1 tablet by mouth daily. 90 tablet 1  ? pravastatin (PRAVACHOL) 10 MG tablet Take 10 mg by mouth daily.    ? traMADol (ULTRAM) 50 MG tablet Take 1 tablet (50 mg total) by mouth every 6 (six) hours as needed. (Patient not taking: Reported on 02/11/2018) 15 tablet 0  ? warfarin (COUMADIN) 2 MG tablet TAKE 3 TABLETS BY MOUTH ONCE DAILY **PATIENT  NEEDS  AN  APPOINTMENT  FOR  LAB  AND  DR.  Mahasin Riviere** 90 tablet 0  ? ?No current facility-administered medications for this visit.  ? ? ?SURGICAL HISTORY: No past surgical history on file. ? ?REVIEW OF SYSTEMS:  A comprehensive review of systems was negative.  ? ?PHYSICAL EXAMINATION: General appearance: alert, cooperative and no distress ?Head: Normocephalic, without obvious abnormality, atraumatic ?Neck: no adenopathy, no JVD, supple, symmetrical, trachea midline and thyroid not enlarged, symmetric, no tenderness/mass/nodules ?Lymph nodes: Cervical, supraclavicular, and axillary nodes normal. ?Resp: clear to auscultation bilaterally ?Back: symmetric, no curvature. ROM normal. No CVA tenderness. ?Cardio: regular rate and rhythm, S1, S2 normal, no murmur, click, rub or gallop ?GI: soft, non-tender; bowel sounds normal; no masses,  no organomegaly ?Extremities: extremities normal, atraumatic, no cyanosis or edema ? ?ECOG PERFORMANCE STATUS: 1 - Symptomatic but completely ambulatory ? ?  Blood pressure (!) 161/88, pulse (!) 107, temperature 98.2 ?F (36.8 ?C), temperature source Tympanic, resp. rate 20, height 6' (1.829 m), weight 185 lb 11.2 oz (84.2 kg), SpO2 99 %. ? ?LABORATORY DATA: ?Lab Results  ?Component Value Date  ? WBC 4.6 10/24/2021  ? HGB 14.4 10/24/2021  ? HCT 42.4 10/24/2021  ? MCV 88.9 10/24/2021  ? PLT 219 10/24/2021  ? ? ?  Chemistry   ?   ?Component Value Date/Time  ? NA 139 04/25/2021 1508  ? NA 141 12/26/2014 0829  ? K 4.0 04/25/2021 1508  ? K 4.1 12/26/2014 0829  ?  CL 106 04/25/2021 1508  ? CL 103 12/21/2012 1529  ? CO2 23 04/25/2021 1508  ? CO2 25 12/26/2014 0829  ? BUN 11 04/25/2021 1508  ? BUN 12.3 12/26/2014 0829  ? CREATININE 1.22 04/25/2021 1508  ? CREATININE 1.2 12/26/2014 0829  ?    ?Component Value Date/Time  ? CALCIUM 9.6 04/25/2021 1508  ? CALCIUM 9.1 12/26/2014 0829  ? ALKPHOS 97 04/25/2021 1508  ? ALKPHOS 74 12/26/2014 0829  ? AST 19 04/25/2021 1508  ? AST 19 12/26/2014 0829  ? ALT 19 04/25/2021 1508  ? ALT 24 12/26/2014 0829  ? BILITOT 0.6 04/25/2021 1508  ? BILITOT 0.47 12/26/2014 0829  ?  ? ? ? ?RADIOGRAPHIC STUDIES: ?No results found. ? ?ASSESSMENT AND PLAN:  ?This is a very pleasant 71 years old African-American male with history of deep venous thrombosis as well as pulmonary embolism and 2006 and has been on treatment with chronic anticoagulation with Coumadin since that time. ?The patient is currently on Coumadin 6 mg p.o. daily and he is tolerating it fairly well. ?Repeat CBC is unremarkable.  INR 2.3. ?I recommended for the patient to continue with the same dose for now. ?I will see him back for follow-up visit in 6 months for evaluation with repeat CBC, PT/INR. ?For the hypertension he was advised to take his blood pressure medication and to monitor it closely at home. ?The patient was advised to call immediately if he has any other concerning symptoms in the interval. ?The patient voices understanding of current disease status and treatment options and is in agreement with the current care plan. ?All questions were answered. The patient knows to call the clinic with any problems, questions or concerns. We can certainly see the patient much sooner if necessary. ? ?Disclaimer: This note was dictated with voice recognition software. Similar sounding words can inadvertently be transcribed and may not be corrected upon review. ? ? ?  ?  ?

## 2021-11-11 ENCOUNTER — Other Ambulatory Visit: Payer: Self-pay | Admitting: Internal Medicine

## 2021-12-15 ENCOUNTER — Other Ambulatory Visit: Payer: Self-pay | Admitting: Internal Medicine

## 2022-01-07 ENCOUNTER — Other Ambulatory Visit: Payer: Self-pay | Admitting: Internal Medicine

## 2022-01-21 ENCOUNTER — Other Ambulatory Visit: Payer: Self-pay | Admitting: Internal Medicine

## 2022-01-21 ENCOUNTER — Other Ambulatory Visit: Payer: Self-pay

## 2022-01-21 NOTE — Telephone Encounter (Signed)
Opened in error. This is a duplicate request.

## 2022-03-28 ENCOUNTER — Telehealth: Payer: Self-pay | Admitting: Internal Medicine

## 2022-03-28 NOTE — Telephone Encounter (Signed)
Called patient regarding upcoming September appointment, left a voicemail.  

## 2022-04-24 ENCOUNTER — Other Ambulatory Visit: Payer: Medicare HMO

## 2022-04-24 ENCOUNTER — Ambulatory Visit: Payer: Medicare HMO | Admitting: Internal Medicine

## 2022-04-28 ENCOUNTER — Other Ambulatory Visit: Payer: Self-pay | Admitting: Internal Medicine

## 2022-05-08 ENCOUNTER — Inpatient Hospital Stay: Payer: Medicare HMO | Attending: Internal Medicine

## 2022-05-08 ENCOUNTER — Inpatient Hospital Stay (HOSPITAL_BASED_OUTPATIENT_CLINIC_OR_DEPARTMENT_OTHER): Payer: Medicare HMO | Admitting: Internal Medicine

## 2022-05-08 ENCOUNTER — Other Ambulatory Visit: Payer: Self-pay

## 2022-05-08 VITALS — BP 173/97 | HR 88 | Temp 97.7°F | Resp 15 | Wt 193.3 lb

## 2022-05-08 DIAGNOSIS — I82402 Acute embolism and thrombosis of unspecified deep veins of left lower extremity: Secondary | ICD-10-CM

## 2022-05-08 DIAGNOSIS — Z8673 Personal history of transient ischemic attack (TIA), and cerebral infarction without residual deficits: Secondary | ICD-10-CM | POA: Insufficient documentation

## 2022-05-08 DIAGNOSIS — Z86711 Personal history of pulmonary embolism: Secondary | ICD-10-CM | POA: Diagnosis present

## 2022-05-08 DIAGNOSIS — Z79899 Other long term (current) drug therapy: Secondary | ICD-10-CM | POA: Insufficient documentation

## 2022-05-08 DIAGNOSIS — Z7901 Long term (current) use of anticoagulants: Secondary | ICD-10-CM | POA: Diagnosis not present

## 2022-05-08 DIAGNOSIS — I10 Essential (primary) hypertension: Secondary | ICD-10-CM | POA: Diagnosis not present

## 2022-05-08 DIAGNOSIS — I82502 Chronic embolism and thrombosis of unspecified deep veins of left lower extremity: Secondary | ICD-10-CM

## 2022-05-08 LAB — CBC WITH DIFFERENTIAL (CANCER CENTER ONLY)
Abs Immature Granulocytes: 0.01 10*3/uL (ref 0.00–0.07)
Basophils Absolute: 0.1 10*3/uL (ref 0.0–0.1)
Basophils Relative: 1 %
Eosinophils Absolute: 0.2 10*3/uL (ref 0.0–0.5)
Eosinophils Relative: 4 %
HCT: 41.2 % (ref 39.0–52.0)
Hemoglobin: 14 g/dL (ref 13.0–17.0)
Immature Granulocytes: 0 %
Lymphocytes Relative: 35 %
Lymphs Abs: 1.8 10*3/uL (ref 0.7–4.0)
MCH: 30.4 pg (ref 26.0–34.0)
MCHC: 34 g/dL (ref 30.0–36.0)
MCV: 89.6 fL (ref 80.0–100.0)
Monocytes Absolute: 0.4 10*3/uL (ref 0.1–1.0)
Monocytes Relative: 8 %
Neutro Abs: 2.6 10*3/uL (ref 1.7–7.7)
Neutrophils Relative %: 52 %
Platelet Count: 200 10*3/uL (ref 150–400)
RBC: 4.6 MIL/uL (ref 4.22–5.81)
RDW: 11.4 % — ABNORMAL LOW (ref 11.5–15.5)
WBC Count: 5.1 10*3/uL (ref 4.0–10.5)
nRBC: 0 % (ref 0.0–0.2)

## 2022-05-08 LAB — PROTIME-INR
INR: 1.5 — ABNORMAL HIGH (ref 0.8–1.2)
Prothrombin Time: 17.5 seconds — ABNORMAL HIGH (ref 11.4–15.2)

## 2022-05-08 NOTE — Progress Notes (Signed)
[place] Telephone:([phone]   Fax:(336) [phone]  OFFICE PROGRESS NOTE  Harvie Junior, MD [place] [address]  DIAGNOSIS: History of deep venous thrombosis and pulmonary embolism diagnosed in 2006.  PRIOR THERAPY: None.  CURRENT THERAPY: Coumadin 6 mg by mouth daily   INTERVAL HISTORY: Travis Morgan 71 y.o. male returns to the clinic today for 6 months follow-up visit.  The patient is feeling fine today with no concerning complaints.  He denied having any current chest pain, shortness of breath, cough or hemoptysis.  He has no nausea, vomiting, diarrhea or constipation.  He has no headache or visual changes.  He has no recent weight loss or night sweats.  He has no bleeding, bruises or ecchymosis.  He continues to tolerate his treatment with Coumadin fairly well.  The patient is here today for evaluation and repeat blood work.  MEDICAL HISTORY: Past Medical History:  Diagnosis Date   DVT (deep venous thrombosis) (HCC)    Stroke (HCC)     ALLERGIES:  has No Known Allergies.  MEDICATIONS:  Current Outpatient Medications  Medication Sig Dispense Refill   ACCU-CHEK GUIDE test strip USE 1 STRIP TO CHECK BLOOD SUGAR ONCE DAILY     acetaminophen (TYLENOL) 500 MG tablet Take 500 mg by mouth every 6 (six) hours as needed for mild pain.      albuterol (VENTOLIN HFA) 108 (90 Base) MCG/ACT inhaler      ATROVENT HFA 17 MCG/ACT inhaler Inhale 1 puff into the lungs 4 (four) times daily as needed.     BREO ELLIPTA 100-25 MCG/INH AEPB      cyclobenzaprine (FLEXERIL) 10 MG tablet cyclobenzaprine 10 mg tablet  TAKE 1 TABLET BY MOUTH THREE TIMES DAILY     ferrous gluconate (FERGON) 324 MG tablet Take 324 mg by mouth daily.     gabapentin (NEURONTIN) 300 MG capsule Take 300 mg by mouth daily.      gabapentin (NEURONTIN) 400 MG capsule      HYDROcodone-ibuprofen (VICOPROFEN) 7.5-200 MG tablet Take 1 tablet by mouth every 6 (six) hours as needed for  moderate pain.      lisinopril-hydrochlorothiazide (PRINZIDE,ZESTORETIC) 20-12.5 MG tablet Take 1 tablet by mouth daily. 90 tablet 1   pravastatin (PRAVACHOL) 10 MG tablet Take 10 mg by mouth daily.     traMADol (ULTRAM) 50 MG tablet Take 1 tablet (50 mg total) by mouth every 6 (six) hours as needed. (Patient not taking: Reported on 02/11/2018) 15 tablet 0   warfarin (COUMADIN) 2 MG tablet TAKE 3 TABLETS BY MOUTH ONCE DAILY . APPOINTMENT REQUIRED FOR FUTURE REFILLS 90 tablet 0   No current facility-administered medications for this visit.    SURGICAL HISTORY: No past surgical history on file.  REVIEW OF SYSTEMS:  A comprehensive review of systems was negative.   PHYSICAL EXAMINATION: General appearance: alert, cooperative and no distress Head: Normocephalic, without obvious abnormality, atraumatic Neck: no adenopathy, no JVD, supple, symmetrical, trachea midline and thyroid not enlarged, symmetric, no tenderness/mass/nodules Lymph nodes: Cervical, supraclavicular, and axillary nodes normal. Resp: clear to auscultation bilaterally Back: symmetric, no curvature. ROM normal. No CVA tenderness. Cardio: regular rate and rhythm, S1, S2 normal, no murmur, click, rub or gallop GI: soft, non-tender; bowel sounds normal; no masses,  no organomegaly Extremities: extremities normal, atraumatic, no cyanosis or edema  ECOG PERFORMANCE STATUS: 1 - Symptomatic but completely ambulatory  Blood pressure (!) 173/97, pulse 88, temperature 97.7 F (36.5  C), temperature source Oral, resp. rate 15, weight 193 lb 4.8 oz (87.7 kg), SpO2 100 %.  LABORATORY DATA: Lab Results  Component Value Date   WBC 5.1 05/08/2022   HGB 14.0 05/08/2022   HCT 41.2 05/08/2022   MCV 89.6 05/08/2022   PLT 200 05/08/2022      Chemistry      Component Value Date/Time   NA 139 04/25/2021 1508   NA 141 12/26/2014 0829   K 4.0 04/25/2021 1508   K 4.1 12/26/2014 0829   CL 106 04/25/2021 1508   CL 103 12/21/2012 1529    CO2 23 04/25/2021 1508   CO2 25 12/26/2014 0829   BUN 11 04/25/2021 1508   BUN 12.3 12/26/2014 0829   CREATININE 1.22 04/25/2021 1508   CREATININE 1.2 12/26/2014 0829      Component Value Date/Time   CALCIUM 9.6 04/25/2021 1508   CALCIUM 9.1 12/26/2014 0829   ALKPHOS 97 04/25/2021 1508   ALKPHOS 74 12/26/2014 0829   AST 19 04/25/2021 1508   AST 19 12/26/2014 0829   ALT 19 04/25/2021 1508   ALT 24 12/26/2014 0829   BILITOT 0.6 04/25/2021 1508   BILITOT 0.47 12/26/2014 0829       RADIOGRAPHIC STUDIES: No results found.  ASSESSMENT AND PLAN:  This is a very pleasant 71 years old African-American male with history of deep venous thrombosis as well as pulmonary embolism and 2006 and has been on treatment with chronic anticoagulation with Coumadin since that time. The patient is currently on Coumadin 6 mg p.o. daily and he is tolerating it fairly well. The patient has been tolerating this treatment well with no concerning complaints.  He has no bleeding, bruises or ecchymosis. He had repeat CBC today that was unremarkable for any abnormality.  PT/INR still pending. I recommended for the patient to continue his current treatment with Coumadin with the same dose unless the PT/INR is outside the therapeutic range, we will adjust his dose. For the hypertension, he was advised to take his blood pressure medication as prescribed.  He was also advised to cut on the salt intake which he takes a lot of it on daily basis. I will see him back for follow-up visit in 6 months for evaluation with repeat blood work. The patient voices understanding of current disease status and treatment options and is in agreement with the current care plan. All questions were answered. The patient knows to call the clinic with any problems, questions or concerns. We can certainly see the patient much sooner if necessary.  Disclaimer: This note was dictated with voice recognition software. Similar sounding words can  inadvertently be transcribed and may not be corrected upon review.

## 2022-05-16 ENCOUNTER — Telehealth: Payer: Self-pay | Admitting: Medical Oncology

## 2022-05-16 NOTE — Telephone Encounter (Signed)
LVM on pts phone re Coumadin dose changes.

## 2022-05-16 NOTE — Telephone Encounter (Signed)
-----   Message from Curt Bears, MD sent at 05/08/2022 12:54 PM EDT ----- Please let the patient know that his INR is low at 1.5.  He will need to take Coumadin 8 mg (4 tablets of the 2 mg) on Mondays and Thursdays and then 6 mg on the other days.  Thank you ----- Message ----- From: Buel Ream, Lab [facility] [place] Sent: 05/08/2022   9:04 AM EDT To: Curt Bears, MD

## 2022-05-29 ENCOUNTER — Other Ambulatory Visit: Payer: Self-pay | Admitting: Internal Medicine

## 2022-06-30 ENCOUNTER — Other Ambulatory Visit: Payer: Self-pay | Admitting: Internal Medicine

## 2022-07-31 ENCOUNTER — Other Ambulatory Visit: Payer: Self-pay | Admitting: Internal Medicine

## 2022-08-30 ENCOUNTER — Other Ambulatory Visit: Payer: Self-pay | Admitting: Internal Medicine

## 2022-09-27 ENCOUNTER — Other Ambulatory Visit: Payer: Self-pay | Admitting: Internal Medicine

## 2022-11-06 ENCOUNTER — Inpatient Hospital Stay (HOSPITAL_BASED_OUTPATIENT_CLINIC_OR_DEPARTMENT_OTHER): Payer: Medicare HMO | Admitting: Internal Medicine

## 2022-11-06 ENCOUNTER — Other Ambulatory Visit: Payer: Medicare HMO

## 2022-11-06 ENCOUNTER — Inpatient Hospital Stay: Payer: Medicare HMO | Attending: Internal Medicine

## 2022-11-06 ENCOUNTER — Other Ambulatory Visit: Payer: Self-pay | Admitting: Internal Medicine

## 2022-11-06 ENCOUNTER — Ambulatory Visit: Payer: Medicare HMO | Admitting: Internal Medicine

## 2022-11-06 ENCOUNTER — Other Ambulatory Visit: Payer: Self-pay

## 2022-11-06 VITALS — BP 178/95 | HR 72 | Temp 97.2°F | Resp 18 | Wt 189.6 lb

## 2022-11-06 DIAGNOSIS — Z8673 Personal history of transient ischemic attack (TIA), and cerebral infarction without residual deficits: Secondary | ICD-10-CM | POA: Diagnosis not present

## 2022-11-06 DIAGNOSIS — Z86711 Personal history of pulmonary embolism: Secondary | ICD-10-CM | POA: Insufficient documentation

## 2022-11-06 DIAGNOSIS — I2782 Chronic pulmonary embolism: Secondary | ICD-10-CM | POA: Diagnosis not present

## 2022-11-06 DIAGNOSIS — Z86718 Personal history of other venous thrombosis and embolism: Secondary | ICD-10-CM | POA: Insufficient documentation

## 2022-11-06 DIAGNOSIS — I2692 Saddle embolus of pulmonary artery without acute cor pulmonale: Secondary | ICD-10-CM

## 2022-11-06 DIAGNOSIS — Z79899 Other long term (current) drug therapy: Secondary | ICD-10-CM | POA: Diagnosis not present

## 2022-11-06 DIAGNOSIS — R5383 Other fatigue: Secondary | ICD-10-CM | POA: Insufficient documentation

## 2022-11-06 DIAGNOSIS — Z7901 Long term (current) use of anticoagulants: Secondary | ICD-10-CM | POA: Diagnosis not present

## 2022-11-06 DIAGNOSIS — I10 Essential (primary) hypertension: Secondary | ICD-10-CM | POA: Insufficient documentation

## 2022-11-06 DIAGNOSIS — I82402 Acute embolism and thrombosis of unspecified deep veins of left lower extremity: Secondary | ICD-10-CM

## 2022-11-06 LAB — CBC WITH DIFFERENTIAL (CANCER CENTER ONLY)
Abs Immature Granulocytes: 0.01 10*3/uL (ref 0.00–0.07)
Basophils Absolute: 0.1 10*3/uL (ref 0.0–0.1)
Basophils Relative: 1 %
Eosinophils Absolute: 0.2 10*3/uL (ref 0.0–0.5)
Eosinophils Relative: 4 %
HCT: 42.9 % (ref 39.0–52.0)
Hemoglobin: 14.3 g/dL (ref 13.0–17.0)
Immature Granulocytes: 0 %
Lymphocytes Relative: 28 %
Lymphs Abs: 1.3 10*3/uL (ref 0.7–4.0)
MCH: 29.8 pg (ref 26.0–34.0)
MCHC: 33.3 g/dL (ref 30.0–36.0)
MCV: 89.4 fL (ref 80.0–100.0)
Monocytes Absolute: 0.5 10*3/uL (ref 0.1–1.0)
Monocytes Relative: 9 %
Neutro Abs: 2.8 10*3/uL (ref 1.7–7.7)
Neutrophils Relative %: 58 %
Platelet Count: 207 10*3/uL (ref 150–400)
RBC: 4.8 MIL/uL (ref 4.22–5.81)
RDW: 11.3 % — ABNORMAL LOW (ref 11.5–15.5)
WBC Count: 4.9 10*3/uL (ref 4.0–10.5)
nRBC: 0 % (ref 0.0–0.2)

## 2022-11-06 LAB — PROTIME-INR
INR: 1.4 — ABNORMAL HIGH (ref 0.8–1.2)
Prothrombin Time: 17.3 seconds — ABNORMAL HIGH (ref 11.4–15.2)

## 2022-11-06 MED ORDER — WARFARIN SODIUM 2 MG PO TABS: ORAL_TABLET | ORAL | 4 refills | Status: DC

## 2022-11-06 NOTE — Progress Notes (Signed)
Travis Morgan:(336) [phone]   Fax:(336) [phone]  OFFICE PROGRESS NOTE  Travis Junior, MD [place] [address]  DIAGNOSIS: History of deep venous thrombosis and pulmonary embolism diagnosed in 2006.  PRIOR THERAPY: None.  CURRENT THERAPY: Coumadin 6 mg by mouth daily except Monday, Wednesday and Friday he will be on 8 mg p.o. daily.  INTERVAL HISTORY: Travis Morgan 72 y.o. male returns to the clinic today for follow-up visit.  The patient is feeling fine today with no concerning complaints except for mild fatigue and uncontrolled hypertension.  He denied having any chest pain, shortness of breath, cough or hemoptysis.  He has no nausea, vomiting, diarrhea or constipation.  He has no headache or visual changes.  He has been tolerating his treatment with warfarin fairly well.  He is here today for evaluation and repeat CBC as well as PT/INR.  MEDICAL HISTORY: Past Medical History:  Diagnosis Date   DVT (deep venous thrombosis) (HCC)    Stroke (HCC)     ALLERGIES:  has No Known Allergies.  MEDICATIONS:  Current Outpatient Medications  Medication Sig Dispense Refill   ACCU-CHEK GUIDE test strip USE 1 STRIP TO CHECK BLOOD SUGAR ONCE DAILY     acetaminophen (TYLENOL) 500 MG tablet Take 500 mg by mouth every 6 (six) hours as needed for mild pain.      albuterol (VENTOLIN HFA) 108 (90 Base) MCG/ACT inhaler      ATROVENT HFA 17 MCG/ACT inhaler Inhale 1 puff into the lungs 4 (four) times daily as needed.     BREO ELLIPTA 100-25 MCG/INH AEPB      cyclobenzaprine (FLEXERIL) 10 MG tablet cyclobenzaprine 10 mg tablet  TAKE 1 TABLET BY MOUTH THREE TIMES DAILY     ferrous gluconate (FERGON) 324 MG tablet Take 324 mg by mouth daily.     gabapentin (NEURONTIN) 300 MG capsule Take 300 mg by mouth daily.      gabapentin (NEURONTIN) 400 MG capsule      HYDROcodone-ibuprofen (VICOPROFEN) 7.5-200 MG tablet Take 1 tablet by mouth every 6 (six) hours as  needed for moderate pain.      lisinopril-hydrochlorothiazide (PRINZIDE,ZESTORETIC) 20-12.5 MG tablet Take 1 tablet by mouth daily. 90 tablet 1   pravastatin (PRAVACHOL) 10 MG tablet Take 10 mg by mouth daily.     traMADol (ULTRAM) 50 MG tablet Take 1 tablet (50 mg total) by mouth every 6 (six) hours as needed. (Patient not taking: Reported on 02/11/2018) 15 tablet 0   warfarin (COUMADIN) 2 MG tablet TAKE 3 TABLETS BY MOUTH ONCE DAILY **  APPOINTMENT  REQUIRED  FOR  FUTURE  REFILLS** 90 tablet 0   No current facility-administered medications for this visit.    SURGICAL HISTORY: No past surgical history on file.  REVIEW OF SYSTEMS:  A comprehensive review of systems was negative except for: Constitutional: positive for fatigue   PHYSICAL EXAMINATION: General appearance: alert, cooperative and no distress Head: Normocephalic, without obvious abnormality, atraumatic Neck: no adenopathy, no JVD, supple, symmetrical, trachea midline and thyroid not enlarged, symmetric, no tenderness/mass/nodules Lymph nodes: Cervical, supraclavicular, and axillary nodes normal. Resp: clear to auscultation bilaterally Back: symmetric, no curvature. ROM normal. No CVA tenderness. Cardio: regular rate and rhythm, S1, S2 normal, no murmur, click, rub or gallop GI: soft, non-tender; bowel sounds normal; no masses,  no organomegaly Extremities: extremities normal, atraumatic, no cyanosis or edema  ECOG PERFORMANCE STATUS: 1 - Symptomatic but completely ambulatory  Blood pressure (!) 178/95, pulse 72, temperature (!) 97.2 F (36.2 C), temperature source Oral, resp. rate 18, weight 189 lb 9 oz (86 kg), SpO2 96 %.  LABORATORY DATA: Lab Results  Component Value Date   WBC 5.1 05/08/2022   HGB 14.0 05/08/2022   HCT 41.2 05/08/2022   MCV 89.6 05/08/2022   PLT 200 05/08/2022      Chemistry      Component Value Date/Time   NA 139 04/25/2021 1508   NA 141 12/26/2014 0829   K 4.0 04/25/2021 1508   K 4.1  12/26/2014 0829   CL 106 04/25/2021 1508   CL 103 12/21/2012 1529   CO2 23 04/25/2021 1508   CO2 25 12/26/2014 0829   BUN 11 04/25/2021 1508   BUN 12.3 12/26/2014 0829   CREATININE 1.22 04/25/2021 1508   CREATININE 1.2 12/26/2014 0829      Component Value Date/Time   CALCIUM 9.6 04/25/2021 1508   CALCIUM 9.1 12/26/2014 0829   ALKPHOS 97 04/25/2021 1508   ALKPHOS 74 12/26/2014 0829   AST 19 04/25/2021 1508   AST 19 12/26/2014 0829   ALT 19 04/25/2021 1508   ALT 24 12/26/2014 0829   BILITOT 0.6 04/25/2021 1508   BILITOT 0.47 12/26/2014 0829       RADIOGRAPHIC STUDIES: No results found.  ASSESSMENT AND PLAN:  This is a very pleasant 72 years old African-American male with history of deep venous thrombosis as well as pulmonary embolism and 2006 and has been on treatment with chronic anticoagulation with Coumadin since that time. The patient is currently on Coumadin 6 mg p.o. daily and he is tolerating it fairly well. His CBC is unremarkable and PT/INR were low at 17.3/1.4. I recommended for the patient to increase his dose of Coumadin to 8 mg on Monday, Wednesday and Friday and stay on 6 mg the other days. I will see him back for follow-up visit in 2 weeks for evaluation and repeat blood work. For the hypertension, he was advised to monitor it closely at home and discuss with his primary care physician if need to start any antihypertensive medication. The patient was advised to call immediately if he has any other concerning symptoms in the interval. The patient voices understanding of current disease status and treatment options and is in agreement with the current care plan. All questions were answered. The patient knows to call the clinic with any problems, questions or concerns. We can certainly see the patient much sooner if necessary.  Disclaimer: This note was dictated with voice recognition software. Similar sounding words can inadvertently be transcribed and may not be  corrected upon review.

## 2022-12-07 ENCOUNTER — Other Ambulatory Visit: Payer: Self-pay | Admitting: Internal Medicine

## 2022-12-21 ENCOUNTER — Other Ambulatory Visit: Payer: Self-pay | Admitting: Internal Medicine

## 2023-02-01 ENCOUNTER — Other Ambulatory Visit: Payer: Self-pay | Admitting: Internal Medicine

## 2023-03-14 ENCOUNTER — Other Ambulatory Visit: Payer: Self-pay | Admitting: Internal Medicine

## 2023-04-19 ENCOUNTER — Other Ambulatory Visit: Payer: Self-pay | Admitting: Internal Medicine

## 2023-05-13 ENCOUNTER — Other Ambulatory Visit: Payer: Self-pay | Admitting: Internal Medicine

## 2023-06-19 ENCOUNTER — Other Ambulatory Visit: Payer: Self-pay | Admitting: Internal Medicine

## 2023-07-27 ENCOUNTER — Other Ambulatory Visit: Payer: Self-pay | Admitting: Internal Medicine

## 2023-08-26 ENCOUNTER — Other Ambulatory Visit: Payer: Self-pay | Admitting: Internal Medicine

## 2023-09-24 ENCOUNTER — Other Ambulatory Visit: Payer: Self-pay | Admitting: Internal Medicine

## 2023-09-26 ENCOUNTER — Telehealth: Payer: Self-pay | Admitting: Internal Medicine

## 2023-09-26 NOTE — Telephone Encounter (Signed)
 Travis Morgan

## 2023-09-29 ENCOUNTER — Telehealth: Payer: Self-pay | Admitting: Medical Oncology

## 2023-09-29 ENCOUNTER — Inpatient Hospital Stay: Payer: Self-pay | Attending: Specialist

## 2023-09-29 NOTE — Telephone Encounter (Signed)
 Appt-Wife left message to call her to schedule appt. She did not leave a return number. I called numbers in chart and no answer.

## 2023-09-30 ENCOUNTER — Telehealth: Payer: Self-pay | Admitting: Internal Medicine

## 2023-09-30 NOTE — Telephone Encounter (Signed)
Rescheduled appointments due to being unable to contact the patient. Have tried to call the patient several times and got no answer. The alternate contact is the wrong number and the patient isn't active on MyChart. Patient will be mailed an appointment reminder.

## 2023-09-30 NOTE — Telephone Encounter (Signed)
Attempted to contact the patient to inform him of changes made. Wasn't able to leave a voicemail, patient isn't active on MyChart and the spouses number was wrong. Will continue to call throughout the day.

## 2023-10-01 ENCOUNTER — Inpatient Hospital Stay: Payer: Self-pay | Admitting: Internal Medicine

## 2023-10-01 ENCOUNTER — Inpatient Hospital Stay: Payer: Self-pay

## 2023-10-20 ENCOUNTER — Telehealth: Payer: Self-pay | Admitting: Medical Oncology

## 2023-10-20 NOTE — Telephone Encounter (Signed)
 Wife confirmed pt appt for tomorrow.

## 2023-10-21 ENCOUNTER — Inpatient Hospital Stay: Payer: Self-pay | Attending: Internal Medicine

## 2023-10-21 ENCOUNTER — Inpatient Hospital Stay: Payer: Self-pay | Admitting: Internal Medicine

## 2023-10-21 VITALS — BP 166/89 | HR 94 | Temp 97.2°F | Resp 18 | Ht 72.0 in | Wt 190.2 lb

## 2023-10-21 DIAGNOSIS — Z7901 Long term (current) use of anticoagulants: Secondary | ICD-10-CM | POA: Insufficient documentation

## 2023-10-21 DIAGNOSIS — I2782 Chronic pulmonary embolism: Secondary | ICD-10-CM

## 2023-10-21 DIAGNOSIS — R410 Disorientation, unspecified: Secondary | ICD-10-CM | POA: Diagnosis not present

## 2023-10-21 DIAGNOSIS — Z86711 Personal history of pulmonary embolism: Secondary | ICD-10-CM | POA: Diagnosis present

## 2023-10-21 DIAGNOSIS — Z8673 Personal history of transient ischemic attack (TIA), and cerebral infarction without residual deficits: Secondary | ICD-10-CM | POA: Diagnosis not present

## 2023-10-21 DIAGNOSIS — Z86718 Personal history of other venous thrombosis and embolism: Secondary | ICD-10-CM | POA: Insufficient documentation

## 2023-10-21 DIAGNOSIS — Z79899 Other long term (current) drug therapy: Secondary | ICD-10-CM | POA: Diagnosis not present

## 2023-10-21 DIAGNOSIS — R1110 Vomiting, unspecified: Secondary | ICD-10-CM | POA: Insufficient documentation

## 2023-10-21 LAB — CBC WITH DIFFERENTIAL (CANCER CENTER ONLY)
Abs Immature Granulocytes: 0.01 10*3/uL (ref 0.00–0.07)
Basophils Absolute: 0.1 10*3/uL (ref 0.0–0.1)
Basophils Relative: 1 %
Eosinophils Absolute: 0.2 10*3/uL (ref 0.0–0.5)
Eosinophils Relative: 5 %
HCT: 42 % (ref 39.0–52.0)
Hemoglobin: 13.9 g/dL (ref 13.0–17.0)
Immature Granulocytes: 0 %
Lymphocytes Relative: 34 %
Lymphs Abs: 1.5 10*3/uL (ref 0.7–4.0)
MCH: 29.7 pg (ref 26.0–34.0)
MCHC: 33.1 g/dL (ref 30.0–36.0)
MCV: 89.7 fL (ref 80.0–100.0)
Monocytes Absolute: 0.4 10*3/uL (ref 0.1–1.0)
Monocytes Relative: 10 %
Neutro Abs: 2.2 10*3/uL (ref 1.7–7.7)
Neutrophils Relative %: 50 %
Platelet Count: 201 10*3/uL (ref 150–400)
RBC: 4.68 MIL/uL (ref 4.22–5.81)
RDW: 11.9 % (ref 11.5–15.5)
WBC Count: 4.3 10*3/uL (ref 4.0–10.5)
nRBC: 0 % (ref 0.0–0.2)

## 2023-10-21 LAB — PROTIME-INR
INR: 1 (ref 0.8–1.2)
Prothrombin Time: 13.5 s (ref 11.4–15.2)

## 2023-10-21 MED ORDER — WARFARIN SODIUM 2 MG PO TABS: ORAL_TABLET | ORAL | 6 refills | Status: DC

## 2023-10-21 NOTE — Progress Notes (Signed)
 [facility] [phone]   Fax:([phone]) [phone]  OFFICE PROGRESS NOTE  Jearld Lesch, MD [address] [place] [address]  DIAGNOSIS: History of deep venous thrombosis and pulmonary embolism diagnosed in 2006.  PRIOR THERAPY: None.  CURRENT THERAPY: Coumadin 6 mg by mouth daily except Monday, Wednesday and Friday he will be on 8 mg p.o. daily.  INTERVAL HISTORY: Travis Morgan 73 y.o. male returns to the clinic today for follow-up.Discussed the use of AI scribe software for clinical note transcription with the patient, who gave verbal consent to proceed.  History of Present Illness   Travis Morgan is a 73 year old male with deep venous thrombosis and pulmonary embolism who presents for anticoagulation management.  He has a history of deep venous thrombosis and pulmonary embolism diagnosed in 2006. He has been on Coumadin for many years, initially at a dose of 6 mg daily. Recently, his INR levels were noted to be low, prompting a recommendation to increase his dose to 8 mg on Monday, Wednesday, and Friday. However, he has not implemented this change and continues to take 6 mg daily.  There has been a lapse in his Coumadin intake for almost a year due to issues with prescription refills. He has not taken Coumadin since his last visit nearly a year ago, despite prescriptions being sent to his pharmacy. He reports confusion about the pharmacy location, which may have contributed to the issue.  He feels 'all right' with no new complaints such as chest pain, leg swelling, nausea, vomiting, diarrhea, or bleeding issues. He mentions experiencing arthritis and occasionally vomiting when it's cold. No epistaxis or gum bleeding, except when blowing his nose hard.       MEDICAL HISTORY: Past Medical History:  Diagnosis Date   DVT (deep venous thrombosis) (HCC)    Stroke (HCC)     ALLERGIES:  has no known allergies.  MEDICATIONS:  Current Outpatient  Medications  Medication Sig Dispense Refill   ACCU-CHEK GUIDE test strip USE 1 STRIP TO CHECK BLOOD SUGAR ONCE DAILY     acetaminophen (TYLENOL) 500 MG tablet Take 500 mg by mouth every 6 (six) hours as needed for mild pain.      albuterol (VENTOLIN HFA) 108 (90 Base) MCG/ACT inhaler      ATROVENT HFA 17 MCG/ACT inhaler Inhale 1 puff into the lungs 4 (four) times daily as needed.     BREO ELLIPTA 100-25 MCG/INH AEPB      cyclobenzaprine (FLEXERIL) 10 MG tablet cyclobenzaprine 10 mg tablet  TAKE 1 TABLET BY MOUTH THREE TIMES DAILY     ferrous gluconate (FERGON) 324 MG tablet Take 324 mg by mouth daily.     gabapentin (NEURONTIN) 300 MG capsule Take 300 mg by mouth daily.      gabapentin (NEURONTIN) 400 MG capsule      HYDROcodone-ibuprofen (VICOPROFEN) 7.5-200 MG tablet Take 1 tablet by mouth every 6 (six) hours as needed for moderate pain.      lisinopril-hydrochlorothiazide (PRINZIDE,ZESTORETIC) 20-12.5 MG tablet Take 1 tablet by mouth daily. 90 tablet 1   pravastatin (PRAVACHOL) 10 MG tablet Take 10 mg by mouth daily.     traMADol (ULTRAM) 50 MG tablet Take 1 tablet (50 mg total) by mouth every 6 (six) hours as needed. (Patient not taking: Reported on 02/11/2018) 15 tablet 0   warfarin (COUMADIN) 2 MG tablet TAKE 3 TABLETS BY MOUTH ONCE DAILY . APPOINTMENT REQUIRED FOR FUTURE REFILLS 90 tablet  0   No current facility-administered medications for this visit.    SURGICAL HISTORY: No past surgical history on file.  REVIEW OF SYSTEMS:  A comprehensive review of systems was negative.   PHYSICAL EXAMINATION: General appearance: alert, cooperative and no distress Head: Normocephalic, without obvious abnormality, atraumatic Neck: no adenopathy, no JVD, supple, symmetrical, trachea midline and thyroid not enlarged, symmetric, no tenderness/mass/nodules Lymph nodes: Cervical, supraclavicular, and axillary nodes normal. Resp: clear to auscultation bilaterally Back: symmetric, no curvature. ROM  normal. No CVA tenderness. Cardio: regular rate and rhythm, S1, S2 normal, no murmur, click, rub or gallop GI: soft, non-tender; bowel sounds normal; no masses,  no organomegaly Extremities: extremities normal, atraumatic, no cyanosis or edema  ECOG PERFORMANCE STATUS: 1 - Symptomatic but completely ambulatory  Blood pressure (!) 166/89, pulse 94, temperature (!) 97.2 F (36.2 C), temperature source Temporal, resp. rate 18, height 6' (1.829 m), weight 190 lb 3.2 oz (86.3 kg), SpO2 100%.  LABORATORY DATA: Lab Results  Component Value Date   WBC 4.3 10/21/2023   HGB 13.9 10/21/2023   HCT 42.0 10/21/2023   MCV 89.7 10/21/2023   PLT 201 10/21/2023      Chemistry      Component Value Date/Time   NA 139 04/25/2021 1508   NA 141 12/26/2014 0829   K 4.0 04/25/2021 1508   K 4.1 12/26/2014 0829   CL 106 04/25/2021 1508   CL 103 12/21/2012 1529   CO2 23 04/25/2021 1508   CO2 25 12/26/2014 0829   BUN 11 04/25/2021 1508   BUN 12.3 12/26/2014 0829   CREATININE 1.22 04/25/2021 1508   CREATININE 1.2 12/26/2014 0829      Component Value Date/Time   CALCIUM 9.6 04/25/2021 1508   CALCIUM 9.1 12/26/2014 0829   ALKPHOS 97 04/25/2021 1508   ALKPHOS 74 12/26/2014 0829   AST 19 04/25/2021 1508   AST 19 12/26/2014 0829   ALT 19 04/25/2021 1508   ALT 24 12/26/2014 0829   BILITOT 0.6 04/25/2021 1508   BILITOT 0.47 12/26/2014 0829       RADIOGRAPHIC STUDIES: No results found.  ASSESSMENT AND PLAN:  This is a very pleasant 73 years old African-American male with history of deep venous thrombosis as well as pulmonary embolism and 2006 and has been on treatment with chronic anticoagulation with Coumadin since that time. The patient is currently on Coumadin 6 mg p.o. daily but unfortunately he ran out of his medication several months ago despite sending refill to his pharmacy but he changed his pharmacy to a different location and was not receiving the refill sent to him.  Deep Venous  Thrombosis (DVT) and Pulmonary Embolism (PE) DVT and PE since 2006, managed with Coumadin. Recent subtherapeutic INR levels due to non-compliance with increased Coumadin dosage. He has not taken Coumadin for almost a year. Emphasized the importance of medication adherence to prevent recurrence and complications such as another PE. Maintaining therapeutic INR levels is crucial for effective anticoagulation. - Prescribe Coumadin 6 mg daily with six refills to [facility] [employer] - Monitor INR levels and adjust dosage as needed - Follow up in six months to assess compliance and therapeutic levels  General Health Maintenance No new complaints or symptoms. No signs of bleeding or bruising. Reports occasional vomiting when cold, but no other gastrointestinal symptoms. - Provide anticipatory guidance on medication adherence  Follow-up - Follow up in six months.   The patient was advised to call immediately if he has any  other concerning symptoms in the interval. For the hypertension, he was advised to monitor it closely at home and discuss with his primary care physician if need to start any antihypertensive medication.  The patient voices understanding of current disease status and treatment options and is in agreement with the current care plan. All questions were answered. The patient knows to call the clinic with any problems, questions or concerns. We can certainly see the patient much sooner if necessary.  Disclaimer: This note was dictated with voice recognition software. Similar sounding words can inadvertently be transcribed and may not be corrected upon review.

## 2024-04-28 ENCOUNTER — Telehealth: Payer: Self-pay | Admitting: Internal Medicine

## 2024-04-28 ENCOUNTER — Inpatient Hospital Stay (HOSPITAL_BASED_OUTPATIENT_CLINIC_OR_DEPARTMENT_OTHER): Admitting: Internal Medicine

## 2024-04-28 ENCOUNTER — Inpatient Hospital Stay: Attending: Internal Medicine

## 2024-04-28 VITALS — BP 132/79 | HR 94 | Temp 97.8°F | Resp 17 | Wt 189.0 lb

## 2024-04-28 DIAGNOSIS — Z7901 Long term (current) use of anticoagulants: Secondary | ICD-10-CM | POA: Diagnosis not present

## 2024-04-28 DIAGNOSIS — D649 Anemia, unspecified: Secondary | ICD-10-CM | POA: Diagnosis not present

## 2024-04-28 DIAGNOSIS — I10 Essential (primary) hypertension: Secondary | ICD-10-CM | POA: Insufficient documentation

## 2024-04-28 DIAGNOSIS — Z79899 Other long term (current) drug therapy: Secondary | ICD-10-CM | POA: Diagnosis not present

## 2024-04-28 DIAGNOSIS — Z86711 Personal history of pulmonary embolism: Secondary | ICD-10-CM | POA: Diagnosis present

## 2024-04-28 DIAGNOSIS — Z8673 Personal history of transient ischemic attack (TIA), and cerebral infarction without residual deficits: Secondary | ICD-10-CM | POA: Insufficient documentation

## 2024-04-28 LAB — CBC WITH DIFFERENTIAL (CANCER CENTER ONLY)
Abs Immature Granulocytes: 0.01 K/uL (ref 0.00–0.07)
Basophils Absolute: 0.1 K/uL (ref 0.0–0.1)
Basophils Relative: 1 %
Eosinophils Absolute: 0.3 K/uL (ref 0.0–0.5)
Eosinophils Relative: 6 %
HCT: 35.8 % — ABNORMAL LOW (ref 39.0–52.0)
Hemoglobin: 11.9 g/dL — ABNORMAL LOW (ref 13.0–17.0)
Immature Granulocytes: 0 %
Lymphocytes Relative: 31 %
Lymphs Abs: 1.5 K/uL (ref 0.7–4.0)
MCH: 28.3 pg (ref 26.0–34.0)
MCHC: 33.2 g/dL (ref 30.0–36.0)
MCV: 85.2 fL (ref 80.0–100.0)
Monocytes Absolute: 0.5 K/uL (ref 0.1–1.0)
Monocytes Relative: 10 %
Neutro Abs: 2.6 K/uL (ref 1.7–7.7)
Neutrophils Relative %: 52 %
Platelet Count: 256 K/uL (ref 150–400)
RBC: 4.2 MIL/uL — ABNORMAL LOW (ref 4.22–5.81)
RDW: 12.7 % (ref 11.5–15.5)
WBC Count: 4.9 K/uL (ref 4.0–10.5)
nRBC: 0 % (ref 0.0–0.2)

## 2024-04-28 LAB — PROTIME-INR
INR: 1.8 — ABNORMAL HIGH (ref 0.8–1.2)
Prothrombin Time: 21.3 s — ABNORMAL HIGH (ref 11.4–15.2)

## 2024-04-28 NOTE — Progress Notes (Signed)
 [facility] Telephone:([phone]   Fax:(336) [phone]  OFFICE PROGRESS NOTE  Trudy Vaughan HERO, MD [address] [place] [address]  DIAGNOSIS: History of deep venous thrombosis and pulmonary embolism diagnosed in 2006.  PRIOR THERAPY: None.  CURRENT THERAPY: Coumadin  6 mg by mouth daily INTERVAL HISTORY: Travis Morgan 73 y.o. male returns to the clinic today for follow-up.Discussed the use of AI scribe software for clinical note transcription with the patient, who gave verbal consent to proceed.  History of Present Illness Travis Morgan is a 73 year old male with a history of deep venous thrombosis and pulmonary embolism who presents for evaluation and repeat blood work.  He has a history of deep venous thrombosis and pulmonary embolism diagnosed in 2006. He is on Coumadin  therapy, taking six milligrams orally every night.  No current symptoms such as chest pain, breathing issues, nausea, vomiting, diarrhea, bleeding, bruising, epistaxis, or gum bleeding. He mentions a 'sprain in my throat' but does not elaborate further.  His hemoglobin level is 11.9 g/dL, and he is not currently taking any iron supplements. He mentions taking a blood pressure pill and a yellow pill, as well as vitamins two or three times a week.  His INR is 1.8.     MEDICAL HISTORY: Past Medical History:  Diagnosis Date   DVT (deep venous thrombosis) (HCC)    Stroke (HCC)     ALLERGIES:  has no known allergies.  MEDICATIONS:  Current Outpatient Medications  Medication Sig Dispense Refill   ACCU-CHEK GUIDE test strip USE 1 STRIP TO CHECK BLOOD SUGAR ONCE DAILY     acetaminophen  (TYLENOL ) 500 MG tablet Take 500 mg by mouth every 6 (six) hours as needed for mild pain.      albuterol (VENTOLIN HFA) 108 (90 Base) MCG/ACT inhaler      ATROVENT HFA 17 MCG/ACT inhaler Inhale 1 puff into the lungs 4 (four) times daily as needed.     BREO ELLIPTA 100-25 MCG/INH AEPB       cyclobenzaprine (FLEXERIL) 10 MG tablet cyclobenzaprine 10 mg tablet  TAKE 1 TABLET BY MOUTH THREE TIMES DAILY     ferrous gluconate (FERGON) 324 MG tablet Take 324 mg by mouth daily.     gabapentin (NEURONTIN) 300 MG capsule Take 300 mg by mouth daily.      gabapentin (NEURONTIN) 400 MG capsule      HYDROcodone -ibuprofen (VICOPROFEN) 7.5-200 MG tablet Take 1 tablet by mouth every 6 (six) hours as needed for moderate pain.      pravastatin (PRAVACHOL) 10 MG tablet Take 10 mg by mouth daily.     traMADol  (ULTRAM ) 50 MG tablet Take 1 tablet (50 mg total) by mouth every 6 (six) hours as needed. 15 tablet 0   warfarin (COUMADIN ) 2 MG tablet TAKE 3 TABLETS BY MOUTH ONCE DAILY . APPOINTMENT REQUIRED FOR FUTURE REFILLS 90 tablet 6   lisinopril -hydrochlorothiazide  (PRINZIDE ,ZESTORETIC ) 20-12.5 MG tablet Take 1 tablet by mouth daily. 90 tablet 1   No current facility-administered medications for this visit.    SURGICAL HISTORY: No past surgical history on file.  REVIEW OF SYSTEMS:  A comprehensive review of systems was negative.   PHYSICAL EXAMINATION: General appearance: alert, cooperative and no distress Head: Normocephalic, without obvious abnormality, atraumatic Neck: no adenopathy, no JVD, supple, symmetrical, trachea midline and thyroid not enlarged, symmetric, no tenderness/mass/nodules Lymph nodes: Cervical, supraclavicular, and axillary nodes normal. Resp: clear to auscultation bilaterally Back: symmetric, no curvature. ROM  normal. No CVA tenderness. Cardio: regular rate and rhythm, S1, S2 normal, no murmur, click, rub or gallop GI: soft, non-tender; bowel sounds normal; no masses,  no organomegaly Extremities: extremities normal, atraumatic, no cyanosis or edema  ECOG PERFORMANCE STATUS: 1 - Symptomatic but completely ambulatory  Blood pressure 132/79, pulse 94, temperature 97.8 F (36.6 C), resp. rate 17, weight 189 lb (85.7 kg), SpO2 100%.  LABORATORY DATA: Lab Results   Component Value Date   WBC 4.9 04/28/2024   HGB 11.9 (L) 04/28/2024   HCT 35.8 (L) 04/28/2024   MCV 85.2 04/28/2024   PLT 256 04/28/2024      Chemistry      Component Value Date/Time   NA 139 04/25/2021 1508   NA 141 12/26/2014 0829   K 4.0 04/25/2021 1508   K 4.1 12/26/2014 0829   CL 106 04/25/2021 1508   CL 103 12/21/2012 1529   CO2 23 04/25/2021 1508   CO2 25 12/26/2014 0829   BUN 11 04/25/2021 1508   BUN 12.3 12/26/2014 0829   CREATININE 1.22 04/25/2021 1508   CREATININE 1.2 12/26/2014 0829      Component Value Date/Time   CALCIUM 9.6 04/25/2021 1508   CALCIUM 9.1 12/26/2014 0829   ALKPHOS 97 04/25/2021 1508   ALKPHOS 74 12/26/2014 0829   AST 19 04/25/2021 1508   AST 19 12/26/2014 0829   ALT 19 04/25/2021 1508   ALT 24 12/26/2014 0829   BILITOT 0.6 04/25/2021 1508   BILITOT 0.47 12/26/2014 0829       RADIOGRAPHIC STUDIES: No results found.  ASSESSMENT AND PLAN:  This is a very pleasant 73 years old African-American male with history of deep venous thrombosis as well as pulmonary embolism and 2006 and has been on treatment with chronic anticoagulation with Coumadin  since that time. The patient is currently on Coumadin  6 mg p.o. daily He has been tolerating this treatment well with no concerning bleeding issues. INR today is 1.8. Assessment and Plan Assessment & Plan Deep venous thrombosis and pulmonary embolism On chronic anticoagulation with Coumadin  6 mg daily. INR is 1.8, slightly below the target range of 2.0. No bleeding, bruising, or other complications related to anticoagulation therapy. - Continue Coumadin  6 mg daily - Re-evaluate INR in 6 months  Mild anemia Hemoglobin level is 11.9 g/dL. No current iron supplementation. Discussion about potential need for multivitamins or iron supplementation. - Consider starting multivitamins or iron supplementation For the hypertension, he was advised to monitor it closely at home and discuss with his primary  care physician if need to start any antihypertensive medication. He was advised to call immediately if he has any concerning symptoms in the interval. The patient voices understanding of current disease status and treatment options and is in agreement with the current care plan. All questions were answered. The patient knows to call the clinic with any problems, questions or concerns. We can certainly see the patient much sooner if necessary.  Disclaimer: This note was dictated with voice recognition software. Similar sounding words can inadvertently be transcribed and may not be corrected upon review.

## 2024-04-29 ENCOUNTER — Telehealth: Payer: Self-pay | Admitting: Internal Medicine

## 2024-04-29 NOTE — Telephone Encounter (Signed)
 Scheduled patient appointments, tried to call patient but could not be reached so I left his wife a voicemail with appointment details.

## 2024-05-10 NOTE — Telephone Encounter (Signed)
 Scheduled patient appointments. Left a voicemail with appointment details

## 2024-05-21 ENCOUNTER — Other Ambulatory Visit: Payer: Self-pay | Admitting: Internal Medicine

## 2024-06-21 ENCOUNTER — Other Ambulatory Visit: Payer: Self-pay | Admitting: Internal Medicine

## 2024-07-23 ENCOUNTER — Other Ambulatory Visit: Payer: Self-pay | Admitting: Internal Medicine

## 2024-08-25 ENCOUNTER — Other Ambulatory Visit: Payer: Self-pay | Admitting: Internal Medicine

## 2024-09-15 ENCOUNTER — Other Ambulatory Visit: Payer: Self-pay | Admitting: Internal Medicine

## 2024-10-26 ENCOUNTER — Inpatient Hospital Stay: Admitting: Internal Medicine

## 2024-10-26 ENCOUNTER — Inpatient Hospital Stay
